# Patient Record
Sex: Male | Born: 1970
Health system: Southern US, Community
[De-identification: ages and names within clinical notes are randomized; demographics above are authoritative.]

## PROBLEM LIST (undated history)

## (undated) DIAGNOSIS — Z789 Other specified health status: Secondary | ICD-10-CM

## (undated) DIAGNOSIS — J45909 Unspecified asthma, uncomplicated: Secondary | ICD-10-CM

## (undated) HISTORY — DX: Other specified health status: Z78.9

---

## 2008-05-27 ENCOUNTER — Encounter: Admission: RE | Admit: 2008-05-27 | Discharge: 2008-05-27 | Payer: Self-pay | Admitting: Chiropractic Medicine

## 2013-03-18 ENCOUNTER — Ambulatory Visit: Payer: Self-pay

## 2017-05-08 ENCOUNTER — Encounter: Payer: Self-pay | Admitting: Family Medicine

## 2017-05-08 ENCOUNTER — Ambulatory Visit (INDEPENDENT_AMBULATORY_CARE_PROVIDER_SITE_OTHER): Payer: Self-pay | Admitting: Family Medicine

## 2017-05-08 VITALS — BP 133/83 | HR 69 | Temp 98.6°F | Resp 14 | Ht 61.0 in | Wt 166.0 lb

## 2017-05-08 DIAGNOSIS — E663 Overweight: Secondary | ICD-10-CM

## 2017-05-08 DIAGNOSIS — R609 Edema, unspecified: Secondary | ICD-10-CM

## 2017-05-08 DIAGNOSIS — Z131 Encounter for screening for diabetes mellitus: Secondary | ICD-10-CM

## 2017-05-08 DIAGNOSIS — Z23 Encounter for immunization: Secondary | ICD-10-CM

## 2017-05-08 DIAGNOSIS — Z789 Other specified health status: Secondary | ICD-10-CM

## 2017-05-08 DIAGNOSIS — Z13220 Encounter for screening for lipoid disorders: Secondary | ICD-10-CM

## 2017-05-08 LAB — CBC
HCT: 43.1 % (ref 38.5–50.0)
HEMOGLOBIN: 14.8 g/dL (ref 13.2–17.1)
MCH: 31.7 pg (ref 27.0–33.0)
MCHC: 34.3 g/dL (ref 32.0–36.0)
MCV: 92.3 fL (ref 80.0–100.0)
MPV: 9.6 fL (ref 7.5–12.5)
Platelets: 317 10*3/uL (ref 140–400)
RBC: 4.67 10*6/uL (ref 4.20–5.80)
RDW: 12.4 % (ref 11.0–15.0)
WBC: 9.1 10*3/uL (ref 3.8–10.8)

## 2017-05-08 LAB — LIPID PANEL
Cholesterol: 230 mg/dL — ABNORMAL HIGH (ref <200)
HDL: 43 mg/dL (ref >40)
LDL Cholesterol (Calc): 149 mg/dL (calc) — ABNORMAL HIGH
Non-HDL Cholesterol (Calc): 187 mg/dL (calc) — ABNORMAL HIGH (ref <130)
TRIGLYCERIDES: 236 mg/dL — AB (ref <150)
Total CHOL/HDL Ratio: 5.3 (calc) — ABNORMAL HIGH (ref <5.0)

## 2017-05-08 LAB — POCT GLYCOSYLATED HEMOGLOBIN (HGB A1C): HEMOGLOBIN A1C: 5.9

## 2017-05-08 NOTE — Progress Notes (Signed)
Subjective:    Patient ID: Mark Love, male    DOB: 06/10/1970, 46 y.o.   MRN: [id number]  HPI Mark Love, a 46 year old male presents accompanied by roommate to establish care.  Patient primarily speaks Spanish, utilizing video interpreter to assist with communication.  Patient has not had a primary provider due to insurance and financial constraints.  Patient states that he is here for routine checkup.  He generally feels well and is without complaint.  He has never had a prostate exam.  He does not follow a low-fat, low-sodium diet.  He denies a family history of  cardiovascular disease, hypertension or diabetes.  He currently denies headache, fatigue, dizziness, chest pains, nausea, vomiting, or diarrhea.   Past Medical History:  Diagnosis Date  . Language barrier to communication    Social History   Socioeconomic History  . Marital status: Married    Spouse name: Not on file  . Number of children: Not on file  . Years of education: Not on file  . Highest education level: Not on file  Social Needs  . Financial resource strain: Not on file  . Food insecurity - worry: Not on file  . Food insecurity - inability: Not on file  . Transportation needs - medical: Not on file  . Transportation needs - non-medical: Not on file  Occupational History  . Not on file  Tobacco Use  . Smoking status: Never Smoker  . Smokeless tobacco: Never Used  Substance and Sexual Activity  . Alcohol use: Yes    Comment: 1 or 2 daily.   . Drug use: No  . Sexual activity: Not on file  Other Topics Concern  . Not on file  Social History Narrative  . Not on file  There is no immunization history for the selected administration types on file for this patient. No Known Allergies Review of Systems  Constitutional: Negative.  Negative for fatigue, fever and unexpected weight change.  Respiratory: Negative.   Cardiovascular: Negative.   Gastrointestinal: Negative.   Endocrine: Negative for  polydipsia, polyphagia and polyuria.  Genitourinary: Negative.   Musculoskeletal: Negative.   Skin: Negative.   Neurological: Negative.   Hematological: Negative.   Psychiatric/Behavioral: Negative.  Negative for decreased concentration, dysphoric mood, sleep disturbance and suicidal ideas.       Objective:   Physical Exam  Constitutional: He is oriented to person, place, and time.  HENT:  Head: Normocephalic and atraumatic.  Right Ear: External ear normal.  Left Ear: External ear normal.  Mouth/Throat: Oropharynx is clear and moist.  Eyes: Conjunctivae are normal. Pupils are equal, round, and reactive to light.  Neck: Normal range of motion. Neck supple.  Pulmonary/Chest: Effort normal and breath sounds normal.  Abdominal: Soft. Bowel sounds are normal. He exhibits no distension. There is no tenderness.  Musculoskeletal: Normal range of motion.  Neurological: He is alert and oriented to person, place, and time. He has normal reflexes.  Skin: Skin is warm and dry.  Right facial swelling, non tender to palpation  Psychiatric: He has a normal mood and affect. His behavior is normal. Judgment and thought content normal.      BP 133/83 (BP Location: Right Arm, Patient Position: Sitting, Cuff Size: Normal)   Pulse 69   Temp 98.6 F (37 C) (Oral)   Resp 14   Ht 5\' 1"  (1.549 m)   Wt 166 lb (75.3 kg)   SpO2 100%   BMI 31.37 kg/m  Assessment & Plan:  1.  Screening for diabetes mellitus - HgB A1c  2. Need for Tdap vaccination - Tdap vaccine greater than or equal to 7yo IM  3. Influenza vaccination given - Flu Vaccine QUAD 36+ mos IM (Fluarix & Fluzone Quad PF  4. Screening cholesterol level - Lipid Panel  5. Overweight Body mass index is 31.37 kg/m. Recommend a lowfat, low carbohydrate diet divided over 5-6 small meals, increase water intake to 6-8 glasses, and 150 minutes per week of cardiovascular exercise.    6. Language barrier to communication Patient primarily  speaks Spanish, utilizing video interpreter to assist with communication  7. Parotid swelling - CBC   RTC: 6 months for CPE   Nolon NationsLaChina Moore Hermina Barnard  MSN, FNP-C Patient [facility] [address]  [place], [place] [id number] [phone]

## 2017-05-08 NOTE — Patient Instructions (Signed)
Colesterol (Cholesterol) El colesterol es una sustancia blanca y cerosa parecida a la grasa que el organismo necesita en pequeas cantidades. El hgado fabrica todo el colesterol que necesita. La sangre lo transporta desde el hgado a travs de los vasos sanguneos. Los depsitos de colesterol (placa) pueden acumularse en las paredes de los vasos sanguneos, lo que ocasiona el estrechamiento y la rigidez de las arterias. Las placas de colesterol aumentan el riesgo de ataque cardaco e ictus. Aunque sea muy elevado, la concentracin de colesterol no puede percibirse. La nica forma de saber que tiene colesterol alto es mediante un anlisis de Poipusangre. Una vez que se conocen las concentraciones de Oncologistcolesterol, se Tourist information centre managerdebe llevar un registro de los Harmonyresultados de Rocky Pointlos anlisis. Trabaje con el mdico para Goodrich Corporationmantener las concentraciones en el rango deseado. QU SIGNIFICAN LOS RESULTADOS?  El colesterol total es una medida general de todo el colesterol en Parissangre.  El LDL es el que se conoce como colesterol malo y es el que se deposita en las paredes de las arterias. Su concentracin debe ser baja.  El HDL es el colesterol bueno porque limpia las arterias y arrastra el LDL. Su concentracin debe ser alta.  Los triglicridos son grasas que el cuerpo puede quemar ya sea para energa o para almacenamiento. Las concentraciones altas estn estrechamente vinculadas con las enfermedades cardacas.  CULES SON LAS CONCENTRACIONES DE COLESTEROL DESEADAS?  El colesterol total por debajo de 200.  El LDL por debajo de 100 en las personas en riesgo y por debajo de 70 en aquellas que corren un riesgo muy alto.  El HDL por Seychellesarriba de 50es bueno y por Seychellesarriba de 60 es lo mejor.  Los triglicridos por debajo de 150.  CMO PUEDO BAJAR EL COLESTEROL?  Dieta Siga los programas de alimentacin que el mdico le indique. ? Elija el pescado o la carne blanca de pollo y Okabenapavo, asados u horneados. Limite los cortes grasos de carne  roja, los alimentos fritos y las carnes procesadas, como las salchichas y los embutidos. ? Coma gran cantidad de frutas y verduras frescas. ? Elija los cereales integrales, los frijoles, las pastas, las papas y los cereales. ? Use solamente pequeas cantidades de aceite de oliva, maz o canola. ? No coma mantequilla, Leakeymayonesa, margarina o aceites de Ajopalmiste. ? Evite los alimentos que contengan grasas trans. ? Hilton Hotelsome leche semidescremada o sin grasa y coma yogur y quesos bajos en grasas o sin grasa. Evite la Eastman Kodakleche entera, la crema, los Lauderdale Lakeshelados, las yemas de Plain Viewhuevo y los quesos enteros. ? Los postres sanos incluyen la torta ngel, los bocadillos de Tunneltonjengibre, las Gaffergalletas con forma de Athaliaanimales, los caramelos duros, los helados de agua y el yogur bajo en grasa o sin grasa. Evite las Eriemasas, tortas, pasteles y Rosendalegalletas.  Haga actividad fsica. Siga los programas de ejercicios segn las indicaciones del mdico. ? Un programa regular ayuda a Advertising account executivebajar el colesterol LDL y aumentar el HDL, ? y a Art gallery managercontrolar el peso. ? Haga cosas que aumenten su nivel de Rancho Banqueteactividad, por Isabelejemplo, haga Barnhartjardinera, salga a caminar o suba y baje las escaleras. Pregntele al mdico cmo puede aumentar la actividad en su vida cotidiana.  Medicamentos. Tome todos los medicamentos como le indic el mdico. ? El mdico puede recetarle medicamentos para ayudar a Advertising account executivebajar el colesterol y reducir el riesgo de enfermedades cardacas. ? Si tiene varios factores de riesgo, tal vez tenga que tomar medicamentos, incluso si las concentraciones son normales.  Esta informacin no tiene como fin  reemplazar el consejo del mdico. Asegrese de hacerle al mdico cualquier pregunta que tenga. Document Released: 03/14/2005 Document Revised: 06/25/2014 Document Reviewed: 12/03/2015 Elsevier Interactive Patient Education  2017 [employer]

## 2017-05-14 ENCOUNTER — Telehealth: Payer: Self-pay

## 2017-05-14 ENCOUNTER — Other Ambulatory Visit: Payer: Self-pay | Admitting: Family Medicine

## 2017-05-14 DIAGNOSIS — E785 Hyperlipidemia, unspecified: Secondary | ICD-10-CM

## 2017-05-14 DIAGNOSIS — E1169 Type 2 diabetes mellitus with other specified complication: Secondary | ICD-10-CM | POA: Insufficient documentation

## 2017-05-14 MED ORDER — ASPIRIN EC 81 MG PO TBEC: 81.0000 mg | DELAYED_RELEASE_TABLET | Freq: Every day | ORAL | 11 refills | Status: DC

## 2017-05-14 MED ORDER — ATORVASTATIN CALCIUM 20 MG PO TABS: 20.0000 mg | ORAL_TABLET | Freq: Every day | ORAL | 3 refills | Status: DC

## 2017-05-14 NOTE — Progress Notes (Signed)
Meds ordered this encounter  Medications  . atorvastatin (LIPITOR) 20 MG tablet    Sig: Take 1 tablet (20 mg total) by mouth daily.    Dispense:  90 tablet    Refill:  3  . aspirin EC 81 MG tablet    Sig: Take 1 tablet (81 mg total) by mouth daily.    Dispense:  30 tablet    Refill:  11

## 2017-05-14 NOTE — Telephone Encounter (Signed)
Called, no answer. NO voicemail picked up, will try later.

## 2017-05-14 NOTE — Telephone Encounter (Signed)
-----   Message from Massie MaroonLachina M Hollis, Oregon[place]NP sent at 05/14/2017  5:40 AM EST ----- Regarding: lab results Please inform patient that cholesterol is elevated. Will start Atorvastatin 20 mg every evening with dinner. Will also start aspirin 81 mg daily as a stroke preventative. Recommend a lowfat, low carbohydrate diet divided over 5-6 small meals, increase water intake to 6-8 glasses, and 150 minutes per week of cardiovascular exercise. Will follow up in 3 months to repeat labs. Please schedule lab appointment.  Thanks

## 2017-05-15 NOTE — Telephone Encounter (Signed)
Called, No answer and no voicemail.

## 2017-05-16 NOTE — Telephone Encounter (Signed)
Tried to call, no answer. Will mail letter to patient. Thanks!

## 2017-09-05 ENCOUNTER — Encounter (HOSPITAL_COMMUNITY): Payer: Self-pay | Admitting: *Deleted

## 2017-09-05 ENCOUNTER — Emergency Department (HOSPITAL_COMMUNITY)
Admission: EM | Admit: 2017-09-05 | Discharge: 2017-09-05 | Disposition: A | Discharge status: Home / Self Care | Payer: Self-pay | Attending: Emergency Medicine | Admitting: Emergency Medicine

## 2017-09-05 ENCOUNTER — Emergency Department (HOSPITAL_COMMUNITY): Payer: Self-pay

## 2017-09-05 ENCOUNTER — Other Ambulatory Visit: Payer: Self-pay

## 2017-09-05 DIAGNOSIS — Y9289 Other specified places as the place of occurrence of the external cause: Secondary | ICD-10-CM | POA: Insufficient documentation

## 2017-09-05 DIAGNOSIS — Y999 Unspecified external cause status: Secondary | ICD-10-CM | POA: Insufficient documentation

## 2017-09-05 DIAGNOSIS — Y9389 Activity, other specified: Secondary | ICD-10-CM | POA: Insufficient documentation

## 2017-09-05 DIAGNOSIS — W231XXA Caught, crushed, jammed, or pinched between stationary objects, initial encounter: Secondary | ICD-10-CM | POA: Insufficient documentation

## 2017-09-05 DIAGNOSIS — S61210A Laceration without foreign body of right index finger without damage to nail, initial encounter: Secondary | ICD-10-CM | POA: Insufficient documentation

## 2017-09-05 MED ORDER — CEPHALEXIN 500 MG PO CAPS: 500.0000 mg | ORAL_CAPSULE | Freq: Four times a day (QID) | ORAL | 0 refills | Status: DC

## 2017-09-05 NOTE — ED Notes (Signed)
Translator used to go over discharge instructions.   

## 2017-09-05 NOTE — ED Notes (Signed)
In-house spanish interpreter [phone]

## 2017-09-05 NOTE — ED Triage Notes (Addendum)
To ED for treatment of lac to bend of right first finger. Pt was working with metal sheet yesterday when injury happened. No bleeding. Able to bend finger but not fully due to swelling. Inhouse medical interpretor used

## 2017-09-05 NOTE — ED Notes (Signed)
Jeff PA at bedside.  

## 2017-09-05 NOTE — ED Provider Notes (Signed)
[facility] EMERGENCY DEPARTMENT Provider Note   CSN: [id number] Arrival date & time: 09/05/17  3664   History   Chief Complaint Chief Complaint  Patient presents with  . Finger Injury    HPI Mark Love is a 47 y.o. male.  HPI   47 year old male presents today with laceration to his right index finger.  Patient notes yesterday afternoon he suffered a laceration to the flexor surface at the PIP of the right second digit.  He notes this pinched between scaffolding.  He denies any loss of range of motion, reports some swelling that started today.  Patient denies any discharge from the wound.  Patient reports he put coffee on to help heal it.    History reviewed. No pertinent past medical history.  There are no active problems to display for this patient.   History reviewed. No pertinent surgical history.     Home Medications    Prior to Admission medications   Medication Sig Start Date End Date Taking? Authorizing Provider  cephALEXin (KEFLEX) 500 MG capsule Take 1 capsule (500 mg total) by mouth 4 (four) times daily. 09/05/17   Eyvonne Mechanic, PA-C    Family History No family history on file.  Social History Social History   Tobacco Use  . Smoking status: Not on file  Substance Use Topics  . Alcohol use: Not on file  . Drug use: Not on file     Allergies   Patient has no known allergies.   Review of Systems Review of Systems  All other systems reviewed and are negative.    Physical Exam Updated Vital Signs BP 122/83 (BP Location: Left Arm)   Pulse 66   Temp 97.7 F (36.5 C) (Oral)   Resp 16   SpO2 96%   Physical Exam  Constitutional: He is oriented to person, place, and time. He appears well-developed and well-nourished.  HENT:  Head: Normocephalic and atraumatic.  Eyes: Pupils are equal, round, and reactive to light. Conjunctivae are normal. Right eye exhibits no discharge. Left eye exhibits no discharge. No scleral  icterus.  Neck: Normal range of motion. No JVD present. No tracheal deviation present.  Pulmonary/Chest: Effort normal. No stridor.  Musculoskeletal:  0.5 cm wound to the right second flexor PIP surface-surrounding redness, no discharge swelling noted throughout the finger full active range of motion  Neurological: He is alert and oriented to person, place, and time. Coordination normal.  Psychiatric: He has a normal mood and affect. His behavior is normal. Judgment and thought content normal.  Nursing note and vitals reviewed.    ED Treatments / Results  Labs (all labs ordered are listed, but only abnormal results are displayed) Labs Reviewed - No data to display  EKG  EKG Interpretation None       Radiology Dg Finger Index Right  Result Date: 09/05/2017 CLINICAL DATA:  Second digit pain EXAM: RIGHT INDEX FINGER 2+V COMPARISON:  None. FINDINGS: Soft tissue swelling is noted. No radiopaque foreign body is seen. No bony abnormality is seen. IMPRESSION: Soft tissue swelling of the second digit without bony abnormality. Electronically Signed   By: Alcide Clever M.D.   On: 09/05/2017 12:00    Procedures Procedures (including critical care time)  Medications Ordered in ED Medications - No data to display   Initial Impression / Assessment and Plan / ED Course  I have reviewed the triage vital signs and the nursing notes.  Pertinent labs & imaging results that were available during  my care of the patient were reviewed by me and considered in my medical decision making (see chart for details).      Final Clinical Impressions(s) / ED Diagnoses   Final diagnoses:  Laceration of right index finger without foreign body without damage to nail, initial encounter    47 year old male presents with a laceration to his hand.  This happened yesterday patient does have swelling I am unable to determine if there is true infection or secondary to the trauma.  However on the side of caution  and treat patient with antibiotics, leave the wound open as this is a very minimal wound.  Patient is instructed to return immediately for any new or worsening signs or symptoms.  Wound was cleansed here, metal splint placed.  Patient discharged with no further questions or concerns.  In-house medical translator was used for assistance.  ED Discharge Orders        Ordered    cephALEXin (KEFLEX) 500 MG capsule  4 times daily     09/05/17 1249       Eyvonne MechanicHedges, Jonia Oakey, PA-C 09/05/17 1258    Mabe, Latanya MaudlinMartha L, MD 09/05/17 1309

## 2017-09-05 NOTE — Discharge Instructions (Addendum)
Please read attached information. If you experience any new or worsening signs or symptoms please return to the emergency room for evaluation. Please follow-up with your primary care provider or specialist as discussed. Please use medication prescribed only as directed and discontinue taking if you have any concerning signs or symptoms.   °

## 2017-10-20 ENCOUNTER — Emergency Department (HOSPITAL_COMMUNITY): Payer: Self-pay

## 2017-10-20 ENCOUNTER — Emergency Department (HOSPITAL_COMMUNITY)
Admission: EM | Admit: 2017-10-20 | Discharge: 2017-10-20 | Disposition: A | Discharge status: Home / Self Care | Payer: Self-pay | Attending: Emergency Medicine | Admitting: Emergency Medicine

## 2017-10-20 ENCOUNTER — Encounter (HOSPITAL_COMMUNITY): Payer: Self-pay

## 2017-10-20 DIAGNOSIS — J069 Acute upper respiratory infection, unspecified: Secondary | ICD-10-CM | POA: Insufficient documentation

## 2017-10-20 DIAGNOSIS — J40 Bronchitis, not specified as acute or chronic: Secondary | ICD-10-CM | POA: Insufficient documentation

## 2017-10-20 DIAGNOSIS — J302 Other seasonal allergic rhinitis: Secondary | ICD-10-CM | POA: Insufficient documentation

## 2017-10-20 DIAGNOSIS — B9789 Other viral agents as the cause of diseases classified elsewhere: Secondary | ICD-10-CM | POA: Insufficient documentation

## 2017-10-20 MED ORDER — PREDNISONE 20 MG PO TABS: 40.0000 mg | ORAL_TABLET | Freq: Every day | ORAL | 0 refills | Status: AC

## 2017-10-20 MED ORDER — PREDNISONE 20 MG PO TABS
60.0000 mg | ORAL_TABLET | Freq: Once | ORAL | Status: AC
  Administered 2017-10-20: 60 mg via ORAL
  Filled 2017-10-20: qty 3

## 2017-10-20 MED ORDER — ALBUTEROL SULFATE HFA 108 (90 BASE) MCG/ACT IN AERS
2.0000 | INHALATION_SPRAY | Freq: Once | RESPIRATORY_TRACT | Status: AC
  Administered 2017-10-20: 2 via RESPIRATORY_TRACT
  Filled 2017-10-20: qty 6.7

## 2017-10-20 MED ORDER — IPRATROPIUM-ALBUTEROL 0.5-2.5 (3) MG/3ML IN SOLN
3.0000 mL | Freq: Once | RESPIRATORY_TRACT | Status: AC
  Administered 2017-10-20: 3 mL via RESPIRATORY_TRACT
  Filled 2017-10-20: qty 3

## 2017-10-20 NOTE — ED Provider Notes (Signed)
[facility] EMERGENCY DEPARTMENT Provider Note   CSN: [id number] Arrival date & time: 10/20/17  1640     History   Chief Complaint Chief Complaint  Patient presents with  . Cough    HPI Mark Love is a 47 y.o. male.  Mark Love is a 47 y.o. Male who is otherwise healthy, presents to the ED for evaluation of nasal congestion, cough and wheezing.  Patient reports he has had a seasonal allergies in the past but they have never bothered him as much as this week, he is not currently on any allergy medications.  Patient reports he has been congested and had a lot of nasal drainage and over the past few days he is also developed a cough that is occasionally productive of phlegm.  Patient denies any chest pain outside of when he is coughing it feels a little sore.  He reports he feels like his breathing is noisy he does not have a history of asthma or COPD and is a non-smoker.  He is able to speak in full sentences and does not appear uncomfortable.  He denies any fevers or chills, no sore throat, or ear pain.  Headaches occasionally from coughing but these do not persist and resolve when coughing spell ends.  No abdominal pain, nausea or vomiting.  Patient has not tried any medications at home to treat symptoms.   Patient is Spanish-speaking, [employer] interpreter services used.  History reviewed. No pertinent past medical history.  There are no active problems to display for this patient.   History reviewed. No pertinent surgical history.      Home Medications    Prior to Admission medications   Medication Sig Start Date End Date Taking? Authorizing Provider  cephALEXin (KEFLEX) 500 MG capsule Take 1 capsule (500 mg total) by mouth 4 (four) times daily. 09/05/17   Hedges, Tinnie Gens, PA-C  predniSONE (DELTASONE) 20 MG tablet Take 2 tablets (40 mg total) by mouth daily for 5 days. 10/20/17 10/25/17  Dartha Lodge, PA-C    Family History No family history on  file.  Social History Social History   Tobacco Use  . Smoking status: Not on file  Substance Use Topics  . Alcohol use: Not on file  . Drug use: Not on file     Allergies   Patient has no known allergies.   Review of Systems Review of Systems  Constitutional: Negative for chills and fever.  HENT: Positive for congestion, postnasal drip, rhinorrhea and sneezing. Negative for ear discharge, ear pain and sore throat.   Eyes: Negative for pain, discharge, redness and itching.  Respiratory: Positive for cough, shortness of breath and wheezing.   Cardiovascular: Negative for chest pain, palpitations and leg swelling.  Gastrointestinal: Negative for abdominal pain, nausea and vomiting.  Musculoskeletal: Negative for arthralgias, joint swelling and myalgias.  Skin: Negative for color change and rash.  Neurological: Positive for headaches. Negative for dizziness and syncope.     Physical Exam Updated Vital Signs BP 124/71 (BP Location: Right Arm)   Pulse 74   Temp 99.9 F (37.7 C) (Oral)   Resp (!) 22   SpO2 96%   Physical Exam  Constitutional: He appears well-developed and well-nourished. No distress.  HENT:  Head: Normocephalic and atraumatic.  Mouth/Throat: Oropharynx is clear and moist.  TMs clear with good landmarks, moderate nasal mucosa edema with clear rhinorrhea, posterior oropharynx clear and moist, with some erythema, no edema or exudates, uvula midline  Eyes: Right eye  exhibits no discharge. Left eye exhibits no discharge.  Neck: Neck supple.  Cardiovascular: Normal rate, regular rhythm, normal heart sounds and intact distal pulses.  Pulmonary/Chest: Effort normal. No stridor. No respiratory distress. He has wheezes. He has no rales.  Respirations equal and unlabored, patient able to speak in full sentences, lungs with bilateral end expiratory wheezes and some transmitted upper airway sounds, but lungs otherwise clear with good air movement, no rhonchi or  crackles   Abdominal: Soft. Bowel sounds are normal. He exhibits no distension and no mass. There is no tenderness. There is no guarding.  Musculoskeletal: He exhibits no deformity.  Neurological: He is alert. Coordination normal.  Skin: Skin is warm and dry. Capillary refill takes less than 2 seconds. He is not diaphoretic.  Psychiatric: He has a normal mood and affect. His behavior is normal.  Nursing note and vitals reviewed.    ED Treatments / Results  Labs (all labs ordered are listed, but only abnormal results are displayed) Labs Reviewed - No data to display  EKG None  Radiology Dg Chest 2 View  Result Date: 10/20/2017 CLINICAL DATA:  Three days of cough and body ache with fatigue. EXAM: CHEST - 2 VIEW COMPARISON:  None. FINDINGS: The heart size and mediastinal contours are within normal limits. Both lungs are clear. The visualized skeletal structures are unremarkable. IMPRESSION: No active cardiopulmonary disease. Electronically Signed   By: Tollie Eth M.D.   On: 10/20/2017 18:39    Procedures Procedures (including critical care time)  Medications Ordered in ED Medications  ipratropium-albuterol (DUONEB) 0.5-2.5 (3) MG/3ML nebulizer solution 3 mL (3 mLs Nebulization Given 10/20/17 1933)  predniSONE (DELTASONE) tablet 60 mg (60 mg Oral Given 10/20/17 1931)  albuterol (PROVENTIL HFA;VENTOLIN HFA) 108 (90 Base) MCG/ACT inhaler 2 puff (2 puffs Inhalation Given 10/20/17 2007)     Initial Impression / Assessment and Plan / ED Course  I have reviewed the triage vital signs and the nursing notes.  Pertinent labs & imaging results that were available during my care of the patient were reviewed by me and considered in my medical decision making (see chart for details).  Patient presents to the ED for a few days of nasal congestion, rhinorrhea and sneezing, associated with productive cough, wheezing and some intermittent shortness of breath.  Patient denies any associated chest pain.   On arrival patient with a low-grade fever, mildly tachypneic with respirations of 22, all other vitals within normal range.  On exam patient with nasal congestion, oropharynx is mildly erythematous but otherwise clear, no evidence of otitis.  Lungs with bilateral scattered end expiratory wheeze, but good air movement throughout.  Will get chest x-ray, and give DuoNeb and dose of prednisone here in the ED.  Patient does not have history of asthma or COPD, and is a non-smoker, suggest reactive bronchitis from exacerbation of seasonal allergies.  Chest x-ray is clear.  Patient's lungs have cleared completely after nebulizer treatment and steroids, patient provided albuterol inhaler in the ED, at this time he stable for discharge home with continued symptomatic management.  Prescribed short burst of steroids.  Encourage patient to use Zyrtec, Flonase and Mucinex as well.  He is to follow-up with his primary doctor.  Return precautions discussed.  Patient expresses understanding and is in agreement with plan.  Final Clinical Impressions(s) / ED Diagnoses   Final diagnoses:  Viral URI with cough  Seasonal allergies  Bronchitis    ED Discharge Orders  Ordered    predniSONE (DELTASONE) 20 MG tablet  Daily     10/20/17 1955       Dartha Lodge, [place] 10/21/17 1308    Arby Barrette, MD 10/21/17 [phone]

## 2017-10-20 NOTE — ED Triage Notes (Signed)
Patient complains of 3 days of cough and with body aches and fatigue, non-smoker, low grade fever

## 2017-10-20 NOTE — ED Notes (Signed)
Used interpretor pad to explain discharge instructions and prescriptions.  No questions at this time.

## 2017-10-20 NOTE — Discharge Instructions (Signed)
Take steroids once daily as directed.  You may use her inhaler as needed every 4-6 hours.  Please use Flonase and Zyrtec to help with nasal congestion, and Mucinex for cough, all these medications can be found over-the-counter.  Please follow-up with the [facility].  Return to the emergency department if you have persistent fevers, shortness of breath or difficulty breathing, chest pain even when you are not coughing or any other new or concerning symptoms.

## 2017-10-30 ENCOUNTER — Emergency Department (HOSPITAL_COMMUNITY): Payer: Self-pay

## 2017-10-30 ENCOUNTER — Emergency Department (HOSPITAL_COMMUNITY)
Admission: EM | Admit: 2017-10-30 | Discharge: 2017-10-31 | Disposition: A | Discharge status: Home / Self Care | Payer: Self-pay | Attending: Emergency Medicine | Admitting: Emergency Medicine

## 2017-10-30 ENCOUNTER — Other Ambulatory Visit: Payer: Self-pay

## 2017-10-30 ENCOUNTER — Encounter (HOSPITAL_COMMUNITY): Payer: Self-pay | Admitting: Emergency Medicine

## 2017-10-30 DIAGNOSIS — R058 Other specified cough: Secondary | ICD-10-CM

## 2017-10-30 DIAGNOSIS — R05 Cough: Secondary | ICD-10-CM | POA: Insufficient documentation

## 2017-10-30 DIAGNOSIS — R062 Wheezing: Secondary | ICD-10-CM | POA: Insufficient documentation

## 2017-10-30 LAB — BASIC METABOLIC PANEL
Anion gap: 9 (ref 5–15)
BUN: 18 mg/dL (ref 6–20)
CALCIUM: 9.2 mg/dL (ref 8.9–10.3)
CO2: 21 mmol/L — AB (ref 22–32)
CREATININE: 0.79 mg/dL (ref 0.61–1.24)
Chloride: 110 mmol/L (ref 101–111)
GFR calc non Af Amer: >60 mL/min (ref >60)
GLUCOSE: 131 mg/dL — AB (ref 65–99)
Potassium: 4 mmol/L (ref 3.5–5.1)
Sodium: 140 mmol/L (ref 135–145)

## 2017-10-30 LAB — CBC
HCT: 46 % (ref 39.0–52.0)
Hemoglobin: 16 g/dL (ref 13.0–17.0)
MCH: 32.5 pg (ref 26.0–34.0)
MCHC: 34.8 g/dL (ref 30.0–36.0)
MCV: 93.3 fL (ref 78.0–100.0)
PLATELETS: 314 10*3/uL (ref 150–400)
RBC: 4.93 MIL/uL (ref 4.22–5.81)
RDW: 13.5 % (ref 11.5–15.5)
WBC: 13 10*3/uL — ABNORMAL HIGH (ref 4.0–10.5)

## 2017-10-30 LAB — I-STAT TROPONIN, ED: TROPONIN I, POC: 0 ng/mL (ref 0.00–0.08)

## 2017-10-30 MED ORDER — CEFTRIAXONE SODIUM 250 MG IJ SOLR
250.0000 mg | Freq: Once | INTRAMUSCULAR | Status: AC
  Administered 2017-10-30: 250 mg via INTRAMUSCULAR
  Filled 2017-10-30: qty 250

## 2017-10-30 MED ORDER — IPRATROPIUM-ALBUTEROL 0.5-2.5 (3) MG/3ML IN SOLN
3.0000 mL | Freq: Once | RESPIRATORY_TRACT | Status: AC
  Administered 2017-10-30: 3 mL via RESPIRATORY_TRACT
  Filled 2017-10-30: qty 3

## 2017-10-30 MED ORDER — LIDOCAINE HCL 1 % IJ SOLN
INTRAMUSCULAR | Status: AC
  Administered 2017-10-30: 0.9 mL
  Filled 2017-10-30: qty 20

## 2017-10-30 MED ORDER — ALBUTEROL SULFATE HFA 108 (90 BASE) MCG/ACT IN AERS
2.0000 | INHALATION_SPRAY | RESPIRATORY_TRACT | Status: DC | PRN
  Administered 2017-10-30: 2 via RESPIRATORY_TRACT
  Filled 2017-10-30: qty 6.7

## 2017-10-30 MED ORDER — AZITHROMYCIN 250 MG PO TABS
500.0000 mg | ORAL_TABLET | Freq: Once | ORAL | Status: AC
  Administered 2017-10-30: 500 mg via ORAL
  Filled 2017-10-30: qty 2

## 2017-10-30 MED ORDER — AZITHROMYCIN 250 MG PO TABS: 250.0000 mg | ORAL_TABLET | Freq: Every day | ORAL | 0 refills | Status: DC

## 2017-10-30 NOTE — ED Triage Notes (Signed)
Patient complaining of neck and mid chest pain. Patient is talking without any difficulty. Patient does not speak spanish. Patient is complaining of headache because of cough. Patient states he is coughing up blood now. Patient states this has been going on for two weeks.

## 2017-10-30 NOTE — ED Provider Notes (Signed)
[facility]-EMERGENCY DEPT Provider Note   CSN: [id number] Arrival date & time: 10/30/17  1945     History   Chief Complaint Chief Complaint  Patient presents with  . Chest Pain  . Neck Pain    HPI Mark Love is a 47 y.o. male.  Patient presents to the emergency department with a chief complaint of cough and chest pain.  He states that he was seen about a week ago for the same.  States that he has had no improvement of his symptoms.  He reports productive cough as well as subjective fevers at home.  He states that he has been coughing so much that his throat is sore and he feels hoarse.  He was recently prescribed prednisone and an inhaler which he took as directed, but has run out of both.  He reports some associated shortness of breath and feels that he is coughing up a lot of phlegm.  He denies any long travel.  Denies any leg pain or swelling.  Denies any travel out of the country.  The history is provided by the patient. The history is limited by a language barrier. A language interpreter was used.    History reviewed. No pertinent past medical history.  There are no active problems to display for this patient.   History reviewed. No pertinent surgical history.      Home Medications    Prior to Admission medications   Not on File    Family History History reviewed. No pertinent family history.  Social History Social History   Tobacco Use  . Smoking status: Never Smoker  . Smokeless tobacco: Never Used  Substance Use Topics  . Alcohol use: Not Currently  . Drug use: Never     Allergies   Patient has no known allergies.   Review of Systems Review of Systems  All other systems reviewed and are negative.    Physical Exam Updated Vital Signs BP 113/81 (BP Location: Left Arm)   Pulse 82   Temp 98.2 F (36.8 C) (Oral)   Resp 16   Ht  (1.626 m)   Wt 80 kg (176 lb 5.9 oz)   SpO2 90%   BMI 30.27 kg/m   Physical Exam    Constitutional: He is oriented to person, place, and time. He appears well-developed and well-nourished.  HENT:  Head: Normocephalic and atraumatic.  Eyes: Pupils are equal, round, and reactive to light. Conjunctivae and EOM are normal. Right eye exhibits no discharge. Left eye exhibits no discharge. No scleral icterus.  Neck: Normal range of motion. Neck supple. No JVD present.  Cardiovascular: Normal rate, regular rhythm and normal heart sounds. Exam reveals no gallop and no friction rub.  No murmur heard. Pulmonary/Chest: Effort normal. No respiratory distress. He has wheezes. He has no rales. He exhibits no tenderness.  Diminished lung sounds in bases and some mild expiratory wheezes  Abdominal: Soft. He exhibits no distension and no mass. There is no tenderness. There is no rebound and no guarding.  Musculoskeletal: Normal range of motion. He exhibits no edema or tenderness.  Neurological: He is alert and oriented to person, place, and time.  Skin: Skin is warm and dry.  Psychiatric: He has a normal mood and affect. His behavior is normal. Judgment and thought content normal.  Nursing note and vitals reviewed.    ED Treatments / Results  Labs (all labs ordered are listed, but only abnormal results are displayed) Labs Reviewed  BASIC  METABOLIC PANEL - Abnormal; Notable for the following components:      Result Value   CO2 21 (*)    Glucose, Bld 131 (*)    All other components within normal limits  CBC - Abnormal; Notable for the following components:   WBC 13.0 (*)    All other components within normal limits  I-STAT TROPONIN, ED    EKG None  Radiology Dg Chest 2 View  Result Date: 10/30/2017 CLINICAL DATA:  Chest pain, cough. EXAM: CHEST - 2 VIEW COMPARISON:  None. FINDINGS: Heart size and mediastinal contours are within normal limits. Lungs are clear. No pleural effusion or pneumothorax seen. Osseous structures about the chest are unremarkable. IMPRESSION: No active  cardiopulmonary disease. No evidence of pneumonia or pulmonary edema. Electronically Signed   By: Bary Richard M.D.   On: 10/30/2017 20:39    Procedures Procedures (including critical care time)  Medications Ordered in ED Medications  ipratropium-albuterol (DUONEB) 0.5-2.5 (3) MG/3ML nebulizer solution 3 mL (has no administration in time range)  albuterol (PROVENTIL HFA;VENTOLIN HFA) 108 (90 Base) MCG/ACT inhaler 2 puff (has no administration in time range)  cefTRIAXone (ROCEPHIN) injection 250 mg (has no administration in time range)  azithromycin (ZITHROMAX) tablet 500 mg (has no administration in time range)     Initial Impression / Assessment and Plan / ED Course  I have reviewed the triage vital signs and the nursing notes.  Pertinent labs & imaging results that were available during my care of the patient were reviewed by me and considered in my medical decision making (see chart for details).     Patient with productive cough x1 week.  He states that the coughing spells have caused him to have a sore throat and hoarseness.  This is the neck pain that he complains of at triage.  Complains of pain in his chest while he is coughing.  He does have some diminished lung sounds as well as some expiratory wheezes on exam.  His O2 saturation is 90%.  He is not tachycardic.  He has no lower extremity swelling and has no other DVT or PE risk factors.  I doubt PE.  I think that he could be developing pneumonia, and will likely benefit from antibiotic therapy.  I will also give him a breathing treatment tonight, and ensure that his O2 saturation goes up.  11:18 PM Significantly improved after a breathing treatment.  Will give one more.  Plan for discharge after the next breathing treatment.  Final Clinical Impressions(s) / ED Diagnoses   Final diagnoses:  Wheezing  Productive cough    ED Discharge Orders        Ordered    azithromycin (ZITHROMAX) 250 MG tablet  Daily     10/30/17 2320        Roxy Horseman, PA-C 10/30/17 2321    Little, Ambrose Finland, MD 10/31/17 [phone]

## 2017-10-30 NOTE — ED Notes (Signed)
EKG delayed pt currently in xray.

## 2017-10-31 ENCOUNTER — Encounter: Payer: Self-pay | Admitting: Family Medicine

## 2017-11-01 ENCOUNTER — Encounter (HOSPITAL_COMMUNITY): Payer: Self-pay

## 2017-11-01 ENCOUNTER — Other Ambulatory Visit: Payer: Self-pay

## 2017-11-01 ENCOUNTER — Emergency Department (HOSPITAL_COMMUNITY)
Admission: EM | Admit: 2017-11-01 | Discharge: 2017-11-02 | Disposition: A | Discharge status: Home / Self Care | Payer: Self-pay | Attending: Emergency Medicine | Admitting: Emergency Medicine

## 2017-11-01 ENCOUNTER — Emergency Department (HOSPITAL_COMMUNITY): Payer: Self-pay

## 2017-11-01 DIAGNOSIS — R059 Cough, unspecified: Secondary | ICD-10-CM

## 2017-11-01 DIAGNOSIS — R05 Cough: Secondary | ICD-10-CM | POA: Insufficient documentation

## 2017-11-01 DIAGNOSIS — J45909 Unspecified asthma, uncomplicated: Secondary | ICD-10-CM | POA: Insufficient documentation

## 2017-11-01 DIAGNOSIS — R0602 Shortness of breath: Secondary | ICD-10-CM | POA: Insufficient documentation

## 2017-11-01 HISTORY — DX: Unspecified asthma, uncomplicated: J45.909

## 2017-11-01 LAB — I-STAT CHEM 8, ED
BUN: 8 mg/dL (ref 6–20)
CALCIUM ION: 1.16 mmol/L (ref 1.15–1.40)
CHLORIDE: 105 mmol/L (ref 101–111)
Creatinine, Ser: 0.7 mg/dL (ref 0.61–1.24)
Glucose, Bld: 119 mg/dL — ABNORMAL HIGH (ref 65–99)
HCT: 47 % (ref 39.0–52.0)
Hemoglobin: 16 g/dL (ref 13.0–17.0)
POTASSIUM: 4 mmol/L (ref 3.5–5.1)
SODIUM: 140 mmol/L (ref 135–145)
TCO2: 26 mmol/L (ref 22–32)

## 2017-11-01 MED ORDER — IOPAMIDOL (ISOVUE-370) INJECTION 76%
100.0000 mL | Freq: Once | INTRAVENOUS | Status: AC | PRN
  Administered 2017-11-01: 100 mL via INTRAVENOUS

## 2017-11-01 MED ORDER — ALBUTEROL SULFATE HFA 108 (90 BASE) MCG/ACT IN AERS: 1.0000 | INHALATION_SPRAY | RESPIRATORY_TRACT | 0 refills | Status: DC | PRN

## 2017-11-01 MED ORDER — DOXYCYCLINE HYCLATE 100 MG PO CAPS: 100.0000 mg | ORAL_CAPSULE | Freq: Two times a day (BID) | ORAL | 0 refills | Status: DC

## 2017-11-01 MED ORDER — DOXYCYCLINE HYCLATE 100 MG PO TABS
100.0000 mg | ORAL_TABLET | Freq: Once | ORAL | Status: AC
  Administered 2017-11-02: 100 mg via ORAL
  Filled 2017-11-01: qty 1

## 2017-11-01 MED ORDER — ALBUTEROL SULFATE (2.5 MG/3ML) 0.083% IN NEBU
2.5000 mg | INHALATION_SOLUTION | Freq: Once | RESPIRATORY_TRACT | Status: AC
  Administered 2017-11-01: 2.5 mg via RESPIRATORY_TRACT
  Filled 2017-11-01: qty 3

## 2017-11-01 MED ORDER — PREDNISONE 20 MG PO TABS: ORAL_TABLET | ORAL | 0 refills | Status: DC

## 2017-11-01 MED ORDER — METHYLPREDNISOLONE SODIUM SUCC 125 MG IJ SOLR
125.0000 mg | Freq: Once | INTRAMUSCULAR | Status: AC
  Administered 2017-11-01: 125 mg via INTRAVENOUS
  Filled 2017-11-01: qty 2

## 2017-11-01 MED ORDER — ALBUTEROL SULFATE (2.5 MG/3ML) 0.083% IN NEBU
5.0000 mg | INHALATION_SOLUTION | Freq: Once | RESPIRATORY_TRACT | Status: AC
  Administered 2017-11-01: 5 mg via RESPIRATORY_TRACT
  Filled 2017-11-01: qty 6

## 2017-11-01 MED ORDER — IOPAMIDOL (ISOVUE-370) INJECTION 76%
INTRAVENOUS | Status: AC
  Filled 2017-11-01: qty 100

## 2017-11-01 NOTE — ED Triage Notes (Signed)
Patient c/o a productive cough with yellow sputum x 2 1/2 weeks. Patient also reports that his rib cage and throat are  sore from coughing. Patient was seen 2 days ago in the ED. Patient reports that his Albuterol inhaler is not working and is ahving wheezing..  Triage completed using Video interpreter-Aida [phone]. Patient is spanish speaking.

## 2017-11-01 NOTE — ED Provider Notes (Signed)
Emergency Department Provider Note   I have reviewed the triage vital signs and the nursing notes.   HISTORY  Chief Complaint Cough and Asthma   HPI Mark Love is a 47 y.o. male with history of asthma who presents the emergency room today with persistent cough and shortness of breath.  Patient has been seen a couple times in the last few days for the same thing was given breathing treatments were seem to help but then shortly after the breathing treatment his cough and shortness of breath comes back.  Letter IV chest pain which has persistent wheezing and a lower neck pain.  No fevers. No other associated or modifying symptoms.    Past Medical History:  Diagnosis Date  . Asthma   . Language barrier to communication     Patient Active Problem List   Diagnosis Date Noted  . Hyperlipidemia LDL goal <100 05/14/2017    History reviewed. No pertinent surgical history.  Current Outpatient Rx  . Order #: [id number] Class: Print  . Order #: [id number] Class: Print  . Order #: [id number] Class: Print  . Order #: [id number] Class: Print    Allergies Patient has no known allergies.  History reviewed. No pertinent family history.  Social History Social History   Tobacco Use  . Smoking status: Never Smoker  . Smokeless tobacco: Never Used  Substance Use Topics  . Alcohol use: Not Currently    Comment: 1 or 2 daily.   . Drug use: No    Review of Systems  All other systems negative except as documented in the HPI. All pertinent positives and negatives as reviewed in the HPI. ____________________________________________   PHYSICAL EXAM:  VITAL SIGNS: ED Triage Vitals  Enc Vitals Group     BP 11/01/17 1551 122/81     Pulse Rate 11/01/17 1551 74     Resp 11/01/17 1551 20     Temp 11/01/17 1551 99.7 F (37.6 C)     Temp Source 11/01/17 1551 Oral     SpO2 11/01/17 1551 93 %     Weight 11/01/17 1635 176 lb (79.8 kg)     Height 11/01/17 1635  (1.626 m)   Head Circumference --      Peak Flow --      Pain Score 11/01/17 1623 8     Pain Loc --      Pain Edu? --      Excl. in GC? --     Constitutional: Alert and oriented. Well appearing and in no acute distress. Eyes: Conjunctivae are normal. PERRL. EOMI. Head: Atraumatic. Nose: No congestion/rhinnorhea. Mouth/Throat: Mucous membranes are moist.  Oropharynx non-erythematous. Neck: No stridor.  No meningeal signs.   Cardiovascular: Normal rate, regular rhythm. Good peripheral circulation. Grossly normal heart sounds.   Respiratory: tachypneic respiratory effort.  No retractions. Lungs diminished with diffuse wheezing. Gastrointestinal: Soft and nontender. No distention.  Musculoskeletal: No lower extremity tenderness nor edema. No gross deformities of extremities. Neurologic:  Normal speech and language. No gross focal neurologic deficits are appreciated.  Skin:  Skin is warm, dry and intact. No rash noted.  ____________________________________________   LABS (all labs ordered are listed, but only abnormal results are displayed)  Labs Reviewed  I-STAT CHEM 8, ED - Abnormal; Notable for the following components:      Result Value   Glucose, Bld 119 (*)    All other components within normal limits   ____________________________________________  EKG   EKG Interpretation  Date/Time:  Friday Nov 01 2017 20:58:50 EDT Ventricular Rate:  74 PR Interval:    QRS Duration: 89 QT Interval:  400 QTC Calculation: 444 R Axis:   55 Text Interpretation:  Age not entered, assumed to be  47 years old for purpose of ECG interpretation Sinus rhythm ST elev, probable normal early repol pattern No old tracing to compare Confirmed by Marily Memos [phone]) on 11/01/2017 10:28:06 PM       ____________________________________________  RADIOLOGY  Ct Angio Chest Pe W And/or Wo Contrast  Result Date: 11/01/2017 CLINICAL DATA:  47 year old male with acute cough and shortness of breath. EXAM: CT  ANGIOGRAPHY CHEST WITH CONTRAST TECHNIQUE: Multidetector CT imaging of the chest was performed using the standard protocol during bolus administration of intravenous contrast. Multiplanar CT image reconstructions and MIPs were obtained to evaluate the vascular anatomy. CONTRAST:  ISOVUE-370 IOPAMIDOL (ISOVUE-370) INJECTION 76% COMPARISON:  None. FINDINGS: Cardiovascular: This study is technically adequate but respiratory motion artifact decreases sensitivity, specially in the LOWER lungs. No pulmonary emboli are identified. Heart size normal. No thoracic aortic aneurysm or pericardial effusion. Mediastinum/Nodes: No enlarged mediastinal, hilar, or axillary lymph nodes. Thyroid gland, trachea, and esophagus demonstrate no significant findings. Lungs/Pleura: 2 small RIGHT UPPER lobe airspace opacities probably represent pneumonia. Mild peribronchial thickening identified. No mass, definite nodule, consolidation, pleural effusion or pneumothorax. Upper Abdomen: No acute abnormality.  Hepatic steatosis identified. Musculoskeletal: No acute or suspicious bony abnormality. Review of the MIP images confirms the above findings. IMPRESSION: 1. No evidence of pulmonary emboli 2. Small RIGHT UPPER lobe airspace opacities-question pneumonia. 3. Mild peribronchial thickening 4. Hepatic steatosis Electronically Signed   By: Harmon Pier M.D.   On: 11/01/2017 21:45    ____________________________________________   PROCEDURES  Procedure(s) performed:   Procedures   ____________________________________________   INITIAL IMPRESSION / ASSESSMENT AND PLAN / ED COURSE  Patient is not improving as expected with the appropriate medications.  Will evaluate other causes for his wheezing such as pulmonary embolus or heart failure with CT scan will give breathing treatments and steroids in the meantime  CT with evidence of pneumonia.  Patient ambulated with oxygen is an 88 to 90% range.  He states he feels much better  than he did previously.  When asked specifically about when he was walking around he states his breathing was not abnormal.  Discharge on antibiotics and continue steroids.     Pertinent labs & imaging results that were available during my care of the patient were reviewed by me and considered in my medical decision making (see chart for details).  ____________________________________________  FINAL CLINICAL IMPRESSION(S) / ED DIAGNOSES  Final diagnoses:  Cough     MEDICATIONS GIVEN DURING THIS VISIT:  Medications  iopamidol (ISOVUE-370) 76 % injection (has no administration in time range)  doxycycline (VIBRA-TABS) tablet 100 mg (has no administration in time range)  albuterol (PROVENTIL) (2.5 MG/3ML) 0.083% nebulizer solution 5 mg (5 mg Nebulization Given 11/01/17 1638)  methylPREDNISolone sodium succinate (SOLU-MEDROL) 125 mg/2 mL injection 125 mg (125 mg Intravenous Given 11/01/17 2047)  iopamidol (ISOVUE-370) 76 % injection 100 mL (100 mLs Intravenous Contrast Given 11/01/17 2126)  albuterol (PROVENTIL) (2.5 MG/3ML) 0.083% nebulizer solution 2.5 mg (2.5 mg Nebulization Given 11/01/17 2215)     NEW OUTPATIENT MEDICATIONS STARTED DURING THIS VISIT:  New Prescriptions   ALBUTEROL (PROVENTIL HFA;VENTOLIN HFA) 108 (90 BASE) MCG/ACT INHALER    Inhale 1-2 puffs into the lungs every 4 (four) hours as needed for wheezing or shortness of breath.  DOXYCYCLINE (VIBRAMYCIN) 100 MG CAPSULE    Take 1 capsule (100 mg total) by mouth 2 (two) times daily. One po bid x 7 days   PREDNISONE (DELTASONE) 20 MG TABLET    2 tabs po daily x 4 days    Note:  This note was prepared with assistance of Dragon voice recognition software. Occasional wrong-word or sound-a-like substitutions may have occurred due to the inherent limitations of voice recognition software.   Marily Memos, MD 11/01/17 [phone]

## 2017-11-01 NOTE — ED Notes (Signed)
Ambulated pt to the bathroom and back to room.  After pt finished using the bathroom his O2 was at 88% and then picked back up to 91% on the way back to his room.

## 2017-11-06 ENCOUNTER — Ambulatory Visit: Payer: Self-pay | Admitting: Family Medicine

## 2017-11-22 ENCOUNTER — Emergency Department (HOSPITAL_COMMUNITY): Payer: Self-pay

## 2017-11-22 ENCOUNTER — Encounter (HOSPITAL_COMMUNITY): Payer: Self-pay

## 2017-11-22 ENCOUNTER — Other Ambulatory Visit: Payer: Self-pay

## 2017-11-22 ENCOUNTER — Emergency Department (HOSPITAL_COMMUNITY)
Admission: EM | Admit: 2017-11-22 | Discharge: 2017-11-22 | Disposition: A | Discharge status: Home / Self Care | Payer: Self-pay | Attending: Emergency Medicine | Admitting: Emergency Medicine

## 2017-11-22 DIAGNOSIS — J4541 Moderate persistent asthma with (acute) exacerbation: Secondary | ICD-10-CM | POA: Insufficient documentation

## 2017-11-22 DIAGNOSIS — R05 Cough: Secondary | ICD-10-CM | POA: Insufficient documentation

## 2017-11-22 DIAGNOSIS — R059 Cough, unspecified: Secondary | ICD-10-CM

## 2017-11-22 DIAGNOSIS — J029 Acute pharyngitis, unspecified: Secondary | ICD-10-CM | POA: Insufficient documentation

## 2017-11-22 DIAGNOSIS — R0981 Nasal congestion: Secondary | ICD-10-CM | POA: Insufficient documentation

## 2017-11-22 MED ORDER — ALBUTEROL SULFATE (2.5 MG/3ML) 0.083% IN NEBU: 2.5000 mg | INHALATION_SOLUTION | RESPIRATORY_TRACT | 12 refills | Status: DC | PRN

## 2017-11-22 MED ORDER — LORATADINE 10 MG PO TABS: 10.0000 mg | ORAL_TABLET | Freq: Every day | ORAL | 0 refills | Status: DC

## 2017-11-22 MED ORDER — PREDNISONE 20 MG PO TABS: ORAL_TABLET | ORAL | 0 refills | Status: DC

## 2017-11-22 MED ORDER — ALBUTEROL SULFATE (2.5 MG/3ML) 0.083% IN NEBU
5.0000 mg | INHALATION_SOLUTION | Freq: Once | RESPIRATORY_TRACT | Status: AC
  Administered 2017-11-22: 5 mg via RESPIRATORY_TRACT
  Filled 2017-11-22: qty 6

## 2017-11-22 MED ORDER — PREDNISONE 20 MG PO TABS
60.0000 mg | ORAL_TABLET | Freq: Once | ORAL | Status: AC
  Administered 2017-11-22: 60 mg via ORAL
  Filled 2017-11-22: qty 3

## 2017-11-22 MED ORDER — IPRATROPIUM BROMIDE 0.02 % IN SOLN
0.5000 mg | Freq: Once | RESPIRATORY_TRACT | Status: AC
Start: 2017-11-22 — End: 2017-11-22
  Administered 2017-11-22: 0.5 mg via RESPIRATORY_TRACT
  Filled 2017-11-22: qty 2.5

## 2017-11-22 MED ORDER — ALBUTEROL SULFATE HFA 108 (90 BASE) MCG/ACT IN AERS: 1.0000 | INHALATION_SPRAY | RESPIRATORY_TRACT | 0 refills | Status: DC | PRN

## 2017-11-22 NOTE — ED Notes (Signed)
Patient transported to X-ray 

## 2017-11-22 NOTE — ED Notes (Signed)
Bed: WTR7 Expected date:  Expected time:  Means of arrival:  Comments: 

## 2017-11-22 NOTE — Discharge Instructions (Addendum)
Continue to stay well-hydrated. Gargle warm salt water to help with sore throat. Use Mucinex for cough suppression/expectoration of mucus. Use flonase and the netipot to help with nasal congestion and symptoms. Take claritin as directed to help decrease symptoms and frequency of asthma attacks. Use inhaler and/or albuterol nebulizer as directed, as needed for cough/chest congestion/wheezing/shortness of breath/etc. Take prednisone as directed for your asthma exacerbation, starting tomorrow since you received today's dose in the ER today. Follow-up with the  primary care doctor listed above in 5-7 days for recheck of ongoing symptoms and ongoing management of your healthcare; [facility]. Return to emergency department for emergent changing or worsening of symptoms.

## 2017-11-22 NOTE — ED Provider Notes (Signed)
[facility]-EMERGENCY DEPT Provider Note   CSN: [id number] Arrival date & time: 11/22/17  0940     History   Chief Complaint Chief Complaint  Patient presents with  . Sore Throat    HPI Mark Love is a 47 y.o. male with a PMHx of asthma, who presents to the ED with complaints of recurrent wheezing, cough with yellow sputum production, sore throat, and rhinorrhea after finishing his medications given at his last visit.  Chart review reveals that he was seen in the ED on 10/30/17 for cough/URI symptoms; CXR negative and labs reassuring, discharged home with azithromycin and inhaler for presumed early PNA (since he'd been seen about 10 days before that and had continued to worsen).  He was then seen again on 11/01/17 for same thing, had CTA chest done which showed no PE but showed questionable RUL PNA, so he was given albuterol treatments and solumedrol in the ED and improved, and was discharged home on doxycycline, prednisone, and an inhaler.  He states he finished those medications and had done well, his symptoms had resolved, but shortly after finishing the medications his symptoms started returning.  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  Due to the raspiness in his throat he will cough, and sometimes has yellow sputum production.  His symptoms seem to be worse at night, he has used his albuterol inhaler and Advil with some relief of his symptoms.  He is a non-smoker.  He denies any recent sick contacts or travel.  He denies fevers, chills, ear pain/drainage, CP, SOB, abd pain, N/V/D/C, hematuria, dysuria, myalgias, arthralgias, numbness, tingling, focal weakness, or any other complaints at this time.   The history is provided by the patient and medical records. A language interpreter was used.  Sore Throat  Pertinent negatives include no chest pain, no abdominal pain and no  shortness of breath.    Past Medical History:  Diagnosis Date  . Asthma   . Language barrier to communication     Patient Active Problem List   Diagnosis Date Noted  . Hyperlipidemia LDL goal <100 05/14/2017    History reviewed. No pertinent surgical history.      Home Medications    Prior to Admission medications   Medication Sig Start Date End Date Taking? Authorizing Provider  albuterol (PROVENTIL HFA;VENTOLIN HFA) 108 (90 Base) MCG/ACT inhaler Inhale 1-2 puffs into the lungs every 4 (four) hours as needed for wheezing or shortness of breath. 11/01/17   Mesner, Barbara CowerJason, MD  azithromycin (ZITHROMAX) 250 MG tablet Take 1 tablet (250 mg total) by mouth daily. 1 every day until finished. 10/30/17   Roxy HorsemanBrowning, Robert, PA-C  doxycycline (VIBRAMYCIN) 100 MG capsule Take 1 capsule (100 mg total) by mouth 2 (two) times daily. One po bid x 7 days 11/01/17   Mesner, Barbara CowerJason, MD  predniSONE (DELTASONE) 20 MG tablet 2 tabs po daily x 4 days 11/01/17   Mesner, Barbara CowerJason, MD    Family History No family history on file.  Social History Social History   Tobacco Use  . Smoking status: Never Smoker  . Smokeless tobacco: Never Used  Substance Use Topics  . Alcohol use: Not Currently    Comment: 1 or 2 daily.   . Drug use: No     Allergies   Patient has no known allergies.   Review of Systems Review of Systems  Constitutional: Negative for  chills and fever.  HENT: Positive for rhinorrhea and sore throat. Negative for ear discharge and ear pain.   Respiratory: Positive for cough and wheezing. Negative for shortness of breath.   Cardiovascular: Negative for chest pain.  Gastrointestinal: Negative for abdominal pain, constipation, diarrhea, nausea and vomiting.  Genitourinary: Negative for dysuria and hematuria.  Musculoskeletal: Negative for arthralgias and myalgias.  Skin: Negative for color change.  Allergic/Immunologic: Negative for immunocompromised state.  Neurological: Negative for  weakness and numbness.  Psychiatric/Behavioral: Negative for confusion.   All other systems reviewed and are negative for acute change except as noted in the HPI.    Physical Exam Updated Vital Signs BP 106/73   Pulse (!) 59   Temp 97.8 F (36.6 C) (Oral)   Resp 18   Ht 5\' 4"  (1.626 m)   Wt 76.7 kg (169 lb)   SpO2 97%   BMI 29.01 kg/m    Physical Exam  Constitutional: He is oriented to person, place, and time. Vital signs are normal. He appears well-developed and well-nourished.  Non-toxic appearance. No distress.  Afebrile, nontoxic, NAD  HENT:  Head: Normocephalic and atraumatic.  Nose: Mucosal edema and rhinorrhea present.  Mouth/Throat: Uvula is midline, oropharynx is clear and moist and mucous membranes are normal. No trismus in the jaw. No uvula swelling. Tonsils are 0 on the right. Tonsils are 0 on the left. No tonsillar exudate.  Nose mildly congested with clear rhinorrhea. Oropharynx clear and moist, without uvular swelling or deviation, no trismus or drooling, no tonsillar swelling or erythema, no exudates.    Eyes: Conjunctivae and EOM are normal. Right eye exhibits no discharge. Left eye exhibits no discharge.  Neck: Normal range of motion. Neck supple.  Cardiovascular: Normal rate, regular rhythm, normal heart sounds and intact distal pulses. Exam reveals no gallop and no friction rub.  No murmur heard. Pulmonary/Chest: Effort normal. No respiratory distress. He has no decreased breath sounds. He has wheezes. He has rhonchi. He has no rales.  Diffuse expiratory wheezing throughout with scattered rhonchi, no rales, no hypoxia or increased WOB, speaking in full sentences, SpO2 97% on RA  Abdominal: Soft. Normal appearance and bowel sounds are normal. He exhibits no distension. There is no tenderness. There is no rigidity, no rebound, no guarding, no CVA tenderness, no tenderness at McBurney's point and negative Murphy's sign.  Musculoskeletal: Normal range of motion.    Neurological: He is alert and oriented to person, place, and time. He has normal strength. No sensory deficit.  Skin: Skin is warm, dry and intact. No rash noted.  Psychiatric: He has a normal mood and affect.  Nursing note and vitals reviewed.    ED Treatments / Results  Labs (all labs ordered are listed, but only abnormal results are displayed) Labs Reviewed - No data to display  EKG None  Radiology Dg Chest 2 View  Result Date: 11/22/2017 CLINICAL DATA:  Persistent productive cough and wheezing. EXAM: CHEST - 2 VIEW COMPARISON:  None. FINDINGS: The heart size and mediastinal contours are within normal limits. Both lungs are clear. The visualized skeletal structures are unremarkable. IMPRESSION: No active cardiopulmonary disease. No evidence of pneumonia or pulmonary edema. Electronically Signed   By: Bary Richard M.D.   On: 11/22/2017 13:49    Procedures Procedures (including critical care time)  Medications Ordered in ED Medications  albuterol (PROVENTIL) (2.5 MG/3ML) 0.083% nebulizer solution 5 mg (5 mg Nebulization Given 11/22/17 1335)  ipratropium (ATROVENT) nebulizer solution 0.5 mg (0.5 mg Nebulization  Given 11/22/17 1335)  predniSONE (DELTASONE) tablet 60 mg (60 mg Oral Given 11/22/17 1335)     Initial Impression / Assessment and Plan / ED Course  I have reviewed the triage vital signs and the nursing notes.  Pertinent labs & imaging results that were available during my care of the patient were reviewed by me and considered in my medical decision making (see chart for details).     47 y.o. male here with continued sore throat and cough/URI symptoms since stopping the doxycycline and prednisone he was given at his last visit (11/01/17); that visit, he had CTA chest which showed possible RUL PNA which is why he was given doxycycline/etc. Felt better until finishing the meds, then his cough/wheezing/sore throat returned. 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 On exam, diffuse expiratory wheezing with some scattered rhonchi throughout, throat clear, nose with mild rhinorrhea. Will get CXR and give duoneb and prednisone, then reassess shortly; doubt need for labs at this time.  3:21 PM CXR without evidence of PNA or other concerning findings. Lung sounds greatly improved after breathing tx and pt feeling better. I suspect large component of uncontrolled asthma, likely contributed by allergies, and possible environmental exposures at work. Will send home with inhaler and refill rx, as well as refills of his nebulizer meds, prednisone x4 days starting tomorrow, and claritin. Advised use of other OTC remedies for symptomatic relief. He may warrant upgrading his asthma regimen, perhaps with an inhaled steroid, if these issues continue to persist. F/up with PCP/[facility] in 5-7 days for recheck of symptoms and ongoing management of his asthma. I explained the diagnosis and have given explicit precautions to return to the ER including for any other new or worsening symptoms. The patient understands and accepts the medical plan as it's been dictated and I have answered their questions. Discharge instructions concerning home care and prescriptions have been given. The patient is STABLE and is discharged to home in good condition.    Final Clinical Impressions(s) / ED Diagnoses   Final diagnoses:  Moderate persistent asthma with exacerbation  Sore throat  Cough    ED Discharge Orders        Ordered    loratadine (CLARITIN) 10 MG tablet  Daily     11/22/17 1521    predniSONE (DELTASONE) 20 MG tablet     11/22/17 1521    albuterol (PROVENTIL) (2.5 MG/3ML) 0.083% nebulizer solution  Every 4 hours PRN     11/22/17 1521    albuterol (PROVENTIL HFA;VENTOLIN HFA) 108 (90 Base) MCG/ACT inhaler  Every 4 hours PRN     11/22/17 [address], [place], [place] 11/22/17 1522    Raeford Razor, MD 11/25/17 [phone]

## 2017-11-22 NOTE — ED Triage Notes (Signed)
Patient is spanish speaking. Patient was seen 3 weeks ago for a sore throat and asthma. Patient was prescribed Prednisone and Albuterol inhaler. Patient states he was feeling better when taking his medication and is now out of meds and is still having a productive coough and wheezing at times.  Video Interpreter used- Cristian [id number].

## 2017-11-22 NOTE — ED Notes (Signed)
Translator used on [employer]- Gustavo [phone]#[id number]

## 2017-11-27 ENCOUNTER — Ambulatory Visit: Payer: Self-pay

## 2019-02-17 IMAGING — CT CT ANGIO CHEST
2 of 6 series · 18 of 46 positions shown · IV contrast (ISOVUE)
Comparison: None.

CLINICAL DATA: 46-year-old male with acute cough and shortness of
breath.

EXAM:
CT ANGIOGRAPHY CHEST WITH CONTRAST
TECHNIQUE: Multidetector CT imaging of the chest was performed using the
standard protocol during bolus administration of intravenous
contrast. Multiplanar CT image reconstructions and MIPs were
obtained to evaluate the vascular anatomy.
CONTRAST:  100mL ADVZHW-SCL IOPAMIDOL (ADVZHW-SCL) INJECTION 76%

[Series 5: thins · axial · 0.77mm/px · z∈[-67,+174]mm · 15 of 265 slices shown]
[im 12/265  lung]
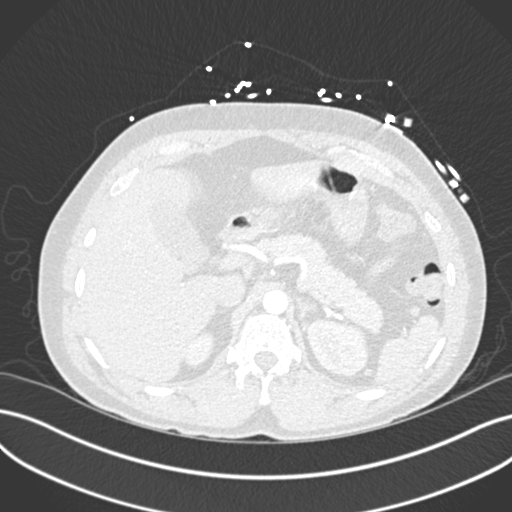
[im 35/265  soft-tissue]
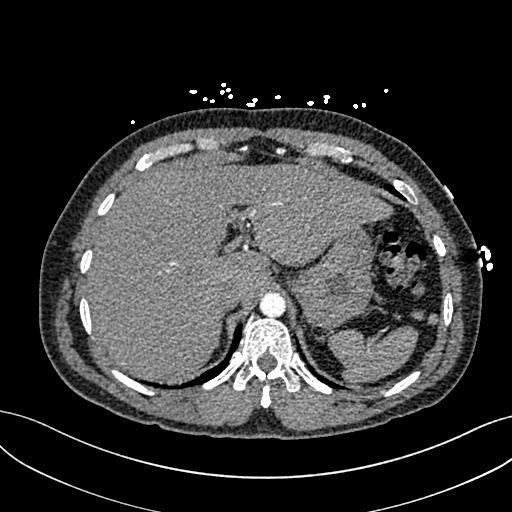
[im 46/265  lung]
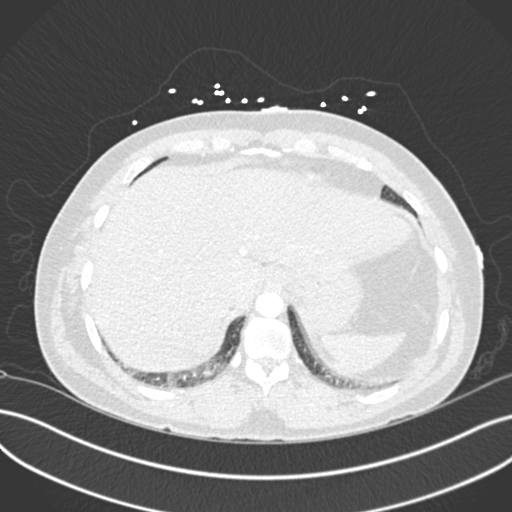
[im 69/265  soft-tissue]
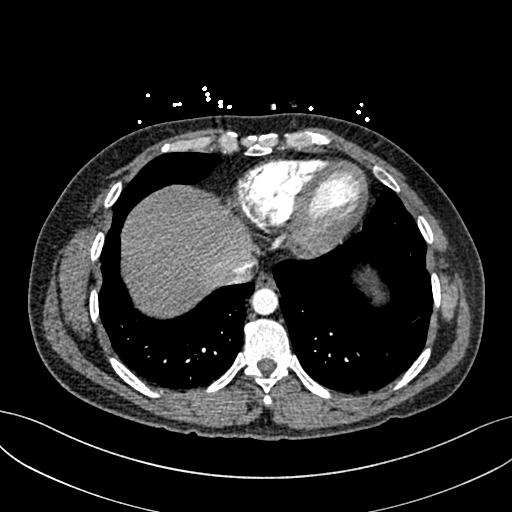
[im 81/265  lung]
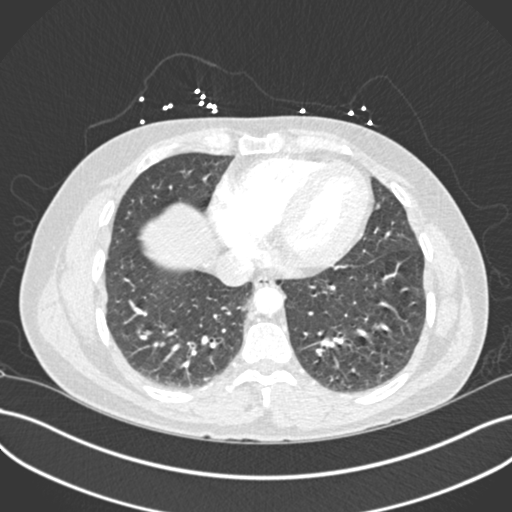
[im 104/265  soft-tissue]
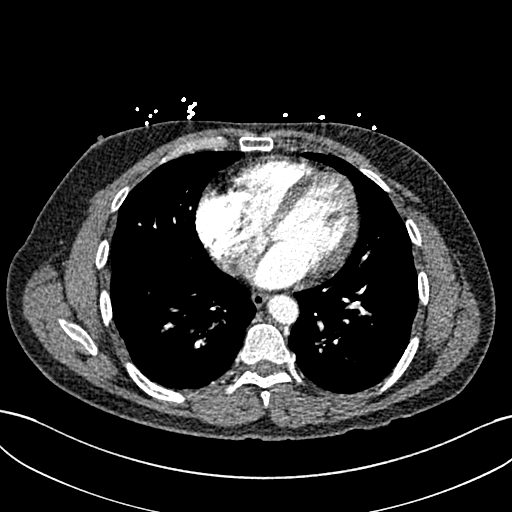
[im 115/265  lung]
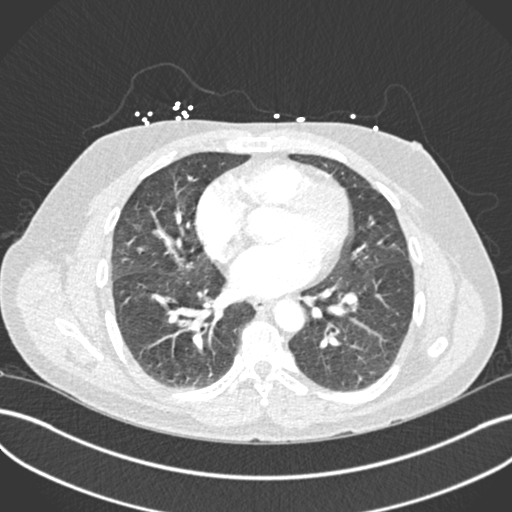
[im 138/265  soft-tissue]
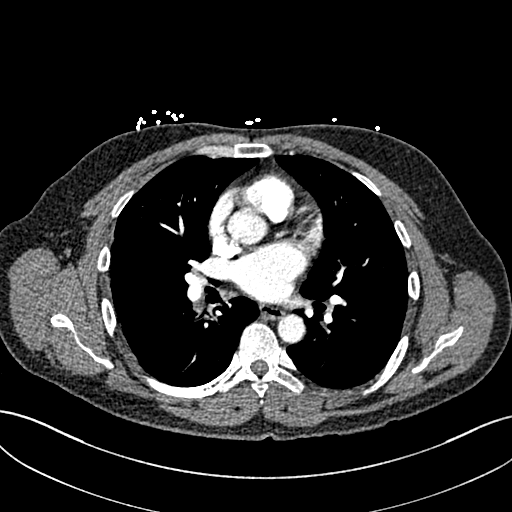
[im 150/265  lung]
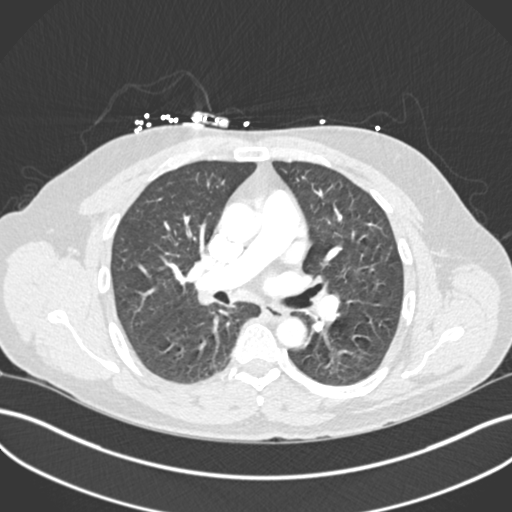
[im 161/265  soft-tissue]
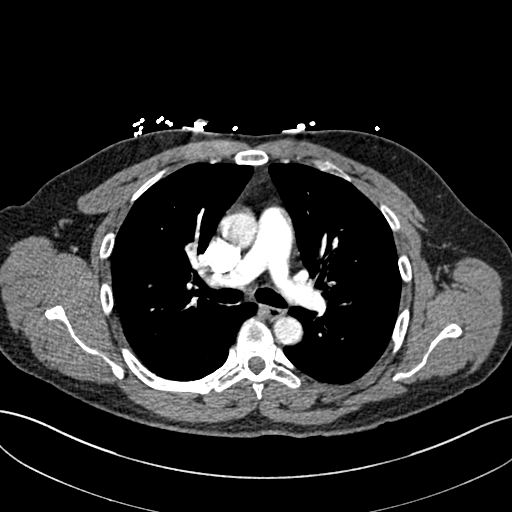
[im 184/265  lung]
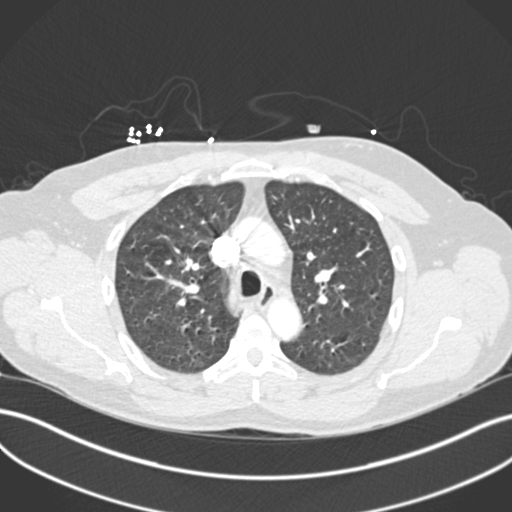
[im 196/265  soft-tissue]
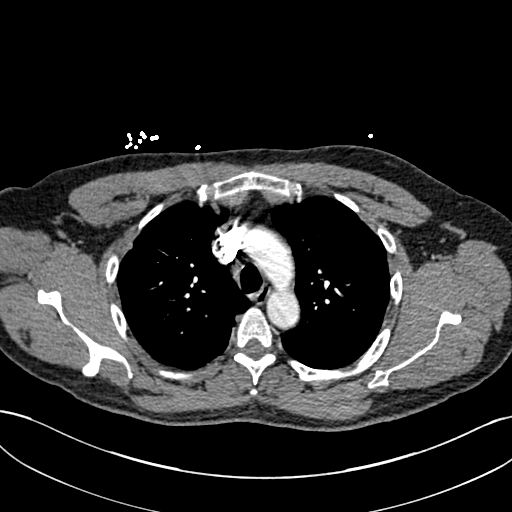
[im 219/265  lung]
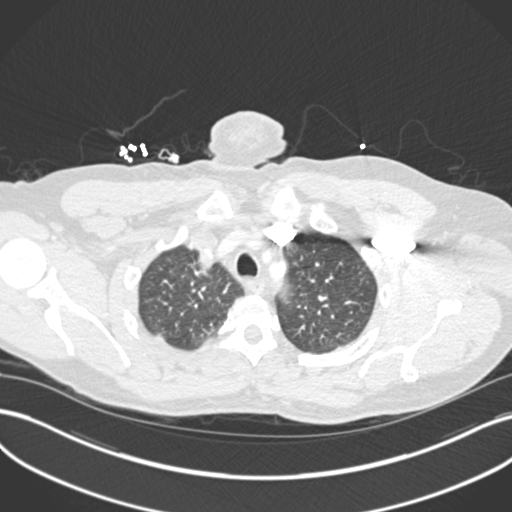
[im 230/265  soft-tissue]
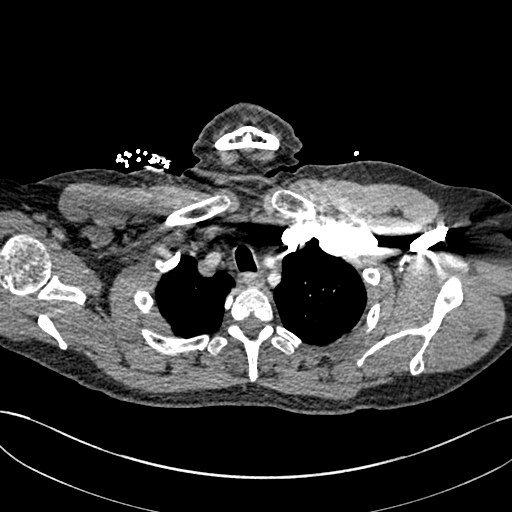
[im 253/265  lung]
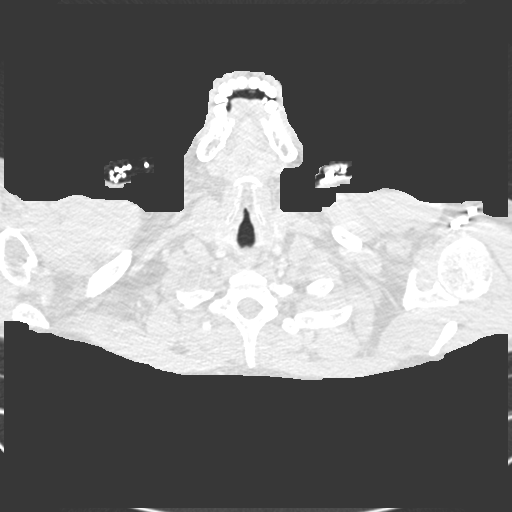

[Series 7: coronal mpr · coronal · 0.54mm/px · 3 of 137 slices shown]
[im 35/137  soft-tissue]
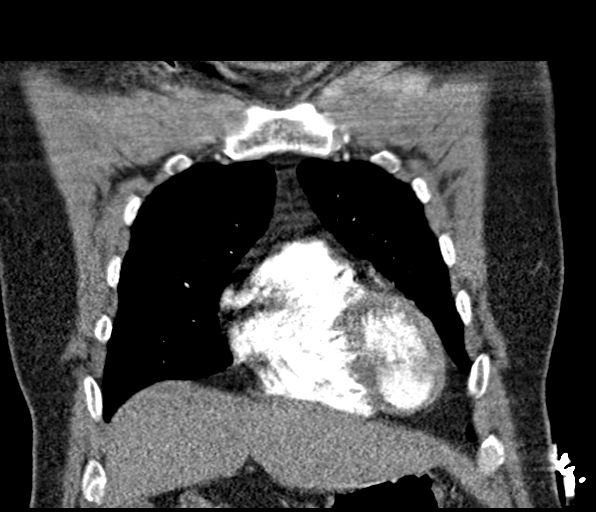
[im 69/137  soft-tissue]
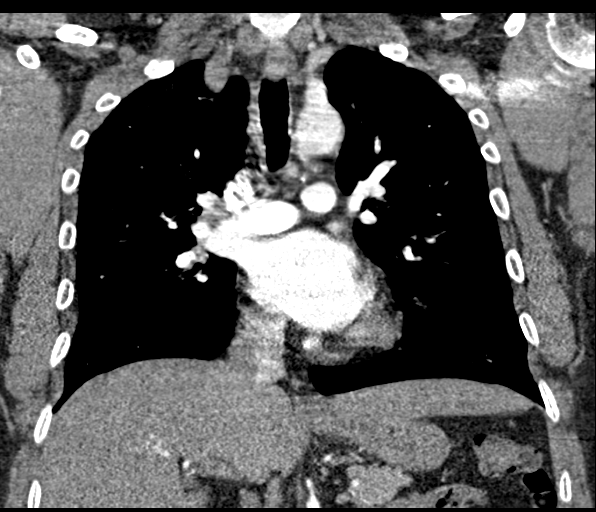
[im 103/137  soft-tissue]
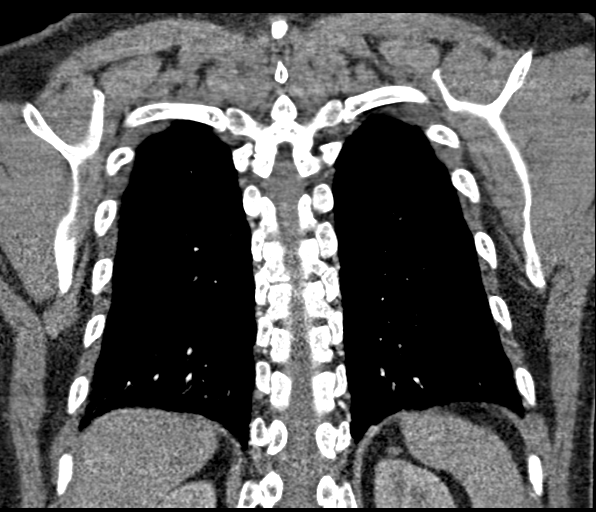

[18 of 46 positions shown; findings below may reference images not displayed]

FINDINGS: Cardiovascular: This study is technically adequate but respiratory
motion artifact decreases sensitivity, specially in the LOWER lungs.
No pulmonary emboli are identified. Heart size normal. No thoracic
aortic aneurysm or pericardial effusion.

Mediastinum/Nodes: No enlarged mediastinal, hilar, or axillary lymph
nodes. Thyroid gland, trachea, and esophagus demonstrate no
significant findings.

Lungs/Pleura: 2 small RIGHT UPPER lobe airspace opacities probably
represent pneumonia. Mild peribronchial thickening identified. No
mass, definite nodule, consolidation, pleural effusion or
pneumothorax.

Upper Abdomen: No acute abnormality.  Hepatic steatosis identified.

Musculoskeletal: No acute or suspicious bony abnormality.

Review of the MIP images confirms the above findings.
IMPRESSION: 1. No evidence of pulmonary emboli
2. Small RIGHT UPPER lobe airspace opacities-question pneumonia.
3. Mild peribronchial thickening
4. Hepatic steatosis

## 2019-11-23 ENCOUNTER — Other Ambulatory Visit: Payer: Self-pay

## 2019-11-23 ENCOUNTER — Encounter (HOSPITAL_COMMUNITY): Payer: Self-pay

## 2019-11-23 ENCOUNTER — Emergency Department (HOSPITAL_COMMUNITY)
Admission: EM | Admit: 2019-11-23 | Discharge: 2019-11-23 | Disposition: A | Discharge status: Home / Self Care | Payer: HRSA Program | Attending: Emergency Medicine | Admitting: Emergency Medicine

## 2019-11-23 ENCOUNTER — Emergency Department (HOSPITAL_COMMUNITY): Payer: HRSA Program

## 2019-11-23 DIAGNOSIS — J45909 Unspecified asthma, uncomplicated: Secondary | ICD-10-CM | POA: Diagnosis not present

## 2019-11-23 DIAGNOSIS — R519 Headache, unspecified: Secondary | ICD-10-CM | POA: Diagnosis not present

## 2019-11-23 DIAGNOSIS — Z87891 Personal history of nicotine dependence: Secondary | ICD-10-CM | POA: Diagnosis not present

## 2019-11-23 DIAGNOSIS — Z7951 Long term (current) use of inhaled steroids: Secondary | ICD-10-CM | POA: Insufficient documentation

## 2019-11-23 DIAGNOSIS — Z20822 Contact with and (suspected) exposure to covid-19: Secondary | ICD-10-CM | POA: Diagnosis not present

## 2019-11-23 DIAGNOSIS — M791 Myalgia, unspecified site: Secondary | ICD-10-CM | POA: Insufficient documentation

## 2019-11-23 DIAGNOSIS — Z792 Long term (current) use of antibiotics: Secondary | ICD-10-CM | POA: Insufficient documentation

## 2019-11-23 DIAGNOSIS — R0602 Shortness of breath: Secondary | ICD-10-CM | POA: Diagnosis present

## 2019-11-23 DIAGNOSIS — R509 Fever, unspecified: Secondary | ICD-10-CM | POA: Insufficient documentation

## 2019-11-23 DIAGNOSIS — U071 COVID-19: Secondary | ICD-10-CM | POA: Diagnosis not present

## 2019-11-23 DIAGNOSIS — J069 Acute upper respiratory infection, unspecified: Secondary | ICD-10-CM

## 2019-11-23 LAB — CBC WITH DIFFERENTIAL/PLATELET
Abs Immature Granulocytes: 0.02 10*3/uL (ref 0.00–0.07)
Basophils Absolute: 0 10*3/uL (ref 0.0–0.1)
Basophils Relative: 0 %
Eosinophils Absolute: 0 10*3/uL (ref 0.0–0.5)
Eosinophils Relative: 0 %
HCT: 48.5 % (ref 39.0–52.0)
Hemoglobin: 16.3 g/dL (ref 13.0–17.0)
Immature Granulocytes: 0 %
Lymphocytes Relative: 29 %
Lymphs Abs: 2.5 10*3/uL (ref 0.7–4.0)
MCH: 32.1 pg (ref 26.0–34.0)
MCHC: 33.6 g/dL (ref 30.0–36.0)
MCV: 95.5 fL (ref 80.0–100.0)
Monocytes Absolute: 0.6 10*3/uL (ref 0.1–1.0)
Monocytes Relative: 7 %
Neutro Abs: 5.3 10*3/uL (ref 1.7–7.7)
Neutrophils Relative %: 64 %
Platelets: 252 10*3/uL (ref 150–400)
RBC: 5.08 MIL/uL (ref 4.22–5.81)
RDW: 12.5 % (ref 11.5–15.5)
WBC: 8.4 10*3/uL (ref 4.0–10.5)
nRBC: 0 % (ref 0.0–0.2)

## 2019-11-23 LAB — POC SARS CORONAVIRUS 2 AG -  ED: SARS Coronavirus 2 Ag: NEGATIVE

## 2019-11-23 LAB — COMPREHENSIVE METABOLIC PANEL
ALT: 47 U/L — ABNORMAL HIGH (ref 0–44)
AST: 55 U/L — ABNORMAL HIGH (ref 15–41)
Albumin: 3.6 g/dL (ref 3.5–5.0)
Alkaline Phosphatase: 111 U/L (ref 38–126)
Anion gap: 12 (ref 5–15)
BUN: 11 mg/dL (ref 6–20)
CO2: 24 mmol/L (ref 22–32)
Calcium: 8.7 mg/dL — ABNORMAL LOW (ref 8.9–10.3)
Chloride: 97 mmol/L — ABNORMAL LOW (ref 98–111)
Creatinine, Ser: 0.82 mg/dL (ref 0.61–1.24)
GFR calc Af Amer: >60 mL/min (ref >60)
GFR calc non Af Amer: >60 mL/min (ref >60)
Glucose, Bld: 145 mg/dL — ABNORMAL HIGH (ref 70–99)
Potassium: 3.7 mmol/L (ref 3.5–5.1)
Sodium: 133 mmol/L — ABNORMAL LOW (ref 135–145)
Total Bilirubin: 1.2 mg/dL (ref 0.3–1.2)
Total Protein: 8.3 g/dL — ABNORMAL HIGH (ref 6.5–8.1)

## 2019-11-23 LAB — SARS CORONAVIRUS 2 (TAT 6-24 HRS): SARS Coronavirus 2: POSITIVE — AB

## 2019-11-23 MED ORDER — KETOROLAC TROMETHAMINE 30 MG/ML IJ SOLN
30.0000 mg | Freq: Once | INTRAMUSCULAR | Status: AC
  Administered 2019-11-23: 30 mg via INTRAMUSCULAR
  Filled 2019-11-23: qty 1

## 2019-11-23 MED ORDER — METOCLOPRAMIDE HCL 5 MG/ML IJ SOLN
10.0000 mg | Freq: Once | INTRAMUSCULAR | Status: AC
  Administered 2019-11-23: 10 mg via INTRAVENOUS

## 2019-11-23 MED ORDER — IPRATROPIUM BROMIDE HFA 17 MCG/ACT IN AERS
2.0000 | INHALATION_SPRAY | Freq: Once | RESPIRATORY_TRACT | Status: AC
  Administered 2019-11-23: 2 via RESPIRATORY_TRACT
  Filled 2019-11-23: qty 12.9

## 2019-11-23 MED ORDER — DOXYCYCLINE HYCLATE 100 MG PO CAPS
100.0000 mg | ORAL_CAPSULE | Freq: Two times a day (BID) | ORAL | 0 refills | Status: AC
Start: 2019-11-23 — End: 2019-11-28

## 2019-11-23 MED ORDER — METOCLOPRAMIDE HCL 5 MG/ML IJ SOLN
10.0000 mg | Freq: Once | INTRAMUSCULAR | Status: DC
  Filled 2019-11-23: qty 2

## 2019-11-23 MED ORDER — ALBUTEROL SULFATE HFA 108 (90 BASE) MCG/ACT IN AERS
4.0000 | INHALATION_SPRAY | Freq: Once | RESPIRATORY_TRACT | Status: AC
  Administered 2019-11-23: 4 via RESPIRATORY_TRACT
  Filled 2019-11-23: qty 6.7

## 2019-11-23 NOTE — Discharge Instructions (Addendum)
Lo vieron hoy por una infeccin de las vas respiratorias superiores, muy probablemente neumona. Trataremos para esto con antibitico. Asegrese de tomar el antibitico con el estmago lleno y complete el curso. Quiero que haga un seguimiento con atencin primaria mientras hablamos sobre esto. Regrese al departamento de emergencias si tiene algn sntoma que empeora o si sus sntomas no desaparecen. Quiero que tome el albuterol dos veces por la maana y dos por la noche durante los prximos [address]. Quiero que tome Atrovent una vez por la noche durante los prximos [address]. Debe realizar un seguimiento con atencin primaria para su asma, niveles elevados de azcar en sangre, TC de cabeza, enzimas hepticas elevadas, como ya comentamos. Quiero que vaya a la atencin primaria anterior o puede encontrar 1 en esta gua. Regrese al departamento de emergencias si tiene sntomas que empeoran, dificultad para Industrial/product designer, Journalist, newspaper, dolor de cabeza que [place]. Puede tomar Tylenol para su dolor de cabeza segn lo prescrito en el frasco. Su prueba de Covid est pendiente, mientras tanto, sus pautas de [employer]. Obtendr [employer] prximos 2 a 3 das. Siga las precauciones de los CDC, lvese las manos puede usar el distanciamiento social.    Mark Love were seen today for upper respiratory infection, most likely pneumonia.  We will treat for this with antibiotic.  Make sure to take the antibiotic on a full stomach and complete the course.  I want you to follow-up with primary care as we discussed about this.  Come back to the emergency department if you have any worsening symptoms or your symptoms do not disappear.  I want you to take the albuterol twice in the morning and twice at night for the next 10 days.  I want you to take the Atrovent once nightly for the next 10 days.  You need to follow-up with primary care for your asthma, elevated blood sugar, CT head, elevated liver enzymes as we  discussed.  I want you to go to the above primary care or you can find 1 on this guide.  Please come back to the emergency department if you have any worsening symptoms, shortness of breath, chest pain, worsening headache.  You can take Tylenol for your headache as prescribed on the bottle.  Your Covid test is pending, you CDC guidelines in the meantime.  You will get these results on MyChart in the next 2 to 3 days.  Follow CDC precautions, wash your hands can use social distancing.

## 2019-11-23 NOTE — ED Provider Notes (Signed)
Medical screening examination/treatment/procedure(s) were conducted as a shared visit with non-physician practitioner(s) and myself.  I personally evaluated the patient during the encounter.    49 year old male presents with asthma headache and fever x8 days.  Work-up here reviewed and patient likely pneumonia.  Head CT negative.  Will place on antibiotics and return precautions given   Lorre Nick, MD 11/23/19 1404

## 2019-11-23 NOTE — ED Provider Notes (Signed)
[facility]-EMERGENCY DEPT Provider Note   CSN: [id number] Arrival date & time: 11/23/19  1021     History Chief Complaint  Patient presents with  . Asthma  . Headache  . Fever  . Generalized Body Aches    Mark Love is a 49 y.o. male with pertinent past medical history of asthma that presents emergency department today for shortness of breath, headache, fever and generalized body aches for the past 8 days.  Patient is Spanish-speaking and interpreter was used.  Patient states that for the past 8 days he has been short of breath, has had asthma before and it feels similar.  States that he has not used inhaler for the past year, patient states that he has not been able to get inhaler..  Patient states that he has never used any asthma medications daily.  Patient states that he also has cough that started 2 days ago, produces sputum.  No hemoptysis.  Patient states that he has chest tightness only when he coughs.  Denies any radiation, chest pain with exertion.  States that this normally occurs when he has asthma.  Has not taken anything for this.  Also admits to fever, has not taken temperature at home.  Also has not taken anything for this.  States that he feels weak with generalized body aches, has not gotten Covid vaccines.  Denies any sick contacts.  States that he has not been eating and drinking normally due to his weakness.  States that he has some vomiting when he needs to cough.  Denies any hematemesis or coffee-ground emesis.  Also admits to headache, 10 out of 10.  States that its worst headache of his life.  Does not normally get headaches.  Is not on any blood thinners.  States that he has some associated dizziness, no vision changes.  Has not tried anything for this.  Has never had any cardiac history. Denies any neck pain when asked, also denies leg swelling, photophobia.   HPI     Past Medical History:  Diagnosis Date  . Asthma   . Language  barrier to communication     Patient Active Problem List   Diagnosis Date Noted  . Hyperlipidemia LDL goal <100 05/14/2017    History reviewed. No pertinent surgical history.     History reviewed. No pertinent family history.  Social History   Tobacco Use  . Smoking status: Never Smoker  . Smokeless tobacco: Former Engineer, waterUser  Substance Use Topics  . Alcohol use: Yes  . Drug use: No    Home Medications Prior to Admission medications   Medication Sig Start Date End Date Taking? Authorizing Provider  albuterol (PROVENTIL HFA;VENTOLIN HFA) 108 (90 Base) MCG/ACT inhaler Inhale 1-2 puffs into the lungs every 4 (four) hours as needed for wheezing or shortness of breath. 11/01/17   Mesner, Barbara CowerJason, MD  albuterol (PROVENTIL HFA;VENTOLIN HFA) 108 (90 Base) MCG/ACT inhaler Inhale 1-2 puffs into the lungs every 4 (four) hours as needed for wheezing or shortness of breath (or cough). 11/22/17   Street, [place], PA-C  albuterol (PROVENTIL) (2.5 MG/3ML) 0.083% nebulizer solution Take 3 mLs (2.5 mg total) by nebulization every 4 (four) hours as needed for wheezing or shortness of breath. 11/22/17   Street, Mercedes, PA-C  azithromycin (ZITHROMAX) 250 MG tablet Take 1 tablet (250 mg total) by mouth daily. 1 every day until finished. 10/30/17   Roxy HorsemanBrowning, Robert, PA-C  doxycycline (VIBRAMYCIN) 100 MG capsule Take 1 capsule (100 mg total) by  mouth 2 (two) times daily for 5 days. 11/23/19 11/28/19  Farrel Gordon, PA-C  loratadine (CLARITIN) 10 MG tablet Take 1 tablet (10 mg total) by mouth daily. One po daily x 5 days 11/22/17   Street, [place], PA-C  predniSONE (DELTASONE) 20 MG tablet 2 tabs po daily x 4 days 11/01/17   Mesner, Barbara Cower, MD  predniSONE (DELTASONE) 20 MG tablet 3 tabs po daily x 4 days STARTING 11/23/17 11/22/17   Street, [place], PA-C    Allergies    Patient has no known allergies.  Review of Systems   Review of Systems  Constitutional: Negative for chills, diaphoresis, fatigue and fever.  HENT:  Negative for congestion, drooling, ear discharge, facial swelling, mouth sores, sinus pressure, sinus pain, sore throat, trouble swallowing and voice change.   Eyes: Negative for pain and visual disturbance.  Respiratory: Positive for cough, chest tightness, shortness of breath and wheezing. Negative for apnea, choking and stridor.   Cardiovascular: Negative for chest pain, palpitations and leg swelling.  Gastrointestinal: Negative for abdominal distention, abdominal pain, diarrhea, nausea and vomiting.  Genitourinary: Negative for difficulty urinating, flank pain and hematuria.  Musculoskeletal: Positive for myalgias. Negative for arthralgias, back pain, gait problem, joint swelling, neck pain and neck stiffness.  Skin: Negative for pallor.  Neurological: Positive for dizziness and headaches. Negative for seizures, syncope, facial asymmetry, speech difficulty, weakness, light-headedness and numbness.  Psychiatric/Behavioral: Negative for confusion.    Physical Exam Updated Vital Signs BP 106/76   Pulse 71   Temp 99.7 F (37.6 C) (Oral)   Resp 20   Ht 5\' 5"  (1.651 m)   Wt 56.7 kg   SpO2 95%   BMI 20.80 kg/m   Physical Exam Constitutional:      General: He is not in acute distress.    Appearance: Normal appearance. He is not ill-appearing, toxic-appearing or diaphoretic.  HENT:     Mouth/Throat:     Mouth: Mucous membranes are moist.     Pharynx: Oropharynx is clear.  Eyes:     General: No visual field deficit or scleral icterus.    Extraocular Movements: Extraocular movements intact.     Pupils: Pupils are equal, round, and reactive to light.  Neck:     Comments: Patient is able to move neck in all directions, without any neck rigidity.  Paraspinal neck tenderness bilaterally.  No midline cervical tenderness. Cardiovascular:     Rate and Rhythm: Normal rate and regular rhythm.     Pulses: Normal pulses.     Heart sounds: Normal heart sounds.  Pulmonary:     Effort:  Pulmonary effort is normal. No respiratory distress.     Breath sounds: No stridor. Wheezing (Mild generalized ) present. No rhonchi or rales.  Chest:     Chest wall: Tenderness (States that it is mild, in the center of his chest ) present.  Abdominal:     General: Abdomen is flat. There is no distension.     Palpations: Abdomen is soft.     Tenderness: There is no abdominal tenderness. There is no guarding or rebound.  Musculoskeletal:        General: Tenderness (Myalgias to sternum, neck, and lower back. No CVA tenderness. No midline tenderness to cervical, thoracic, lumbar spine.) present. No swelling. Normal range of motion.     Cervical back: Normal range of motion and neck supple. No rigidity.     Right lower leg: No edema.     Left lower leg: No edema.  Lymphadenopathy:     Cervical: No cervical adenopathy.  Skin:    General: Skin is warm and dry.     Capillary Refill: Capillary refill takes less than 2 seconds.     Coloration: Skin is not pale.  Neurological:     General: No focal deficit present.     Mental Status: He is alert and oriented to person, place, and time.     Cranial Nerves: No cranial nerve deficit, dysarthria or facial asymmetry.     Sensory: No sensory deficit.     Motor: No weakness.     Coordination: Romberg sign negative. Coordination normal.     Gait: Gait normal.     Comments: .Alert. Clear speech. No facial droop. CNIII-XII grossly intact. Bilateral upper and lower extremities' sensation grossly intact. 5/5 symmetric strength with grip strength and with plantar and dorsi flexion bilaterally.Negative pronator drift. Negative Romberg sign. Gait is steady and intact .   Psychiatric:        Mood and Affect: Mood normal.        Behavior: Behavior normal.     ED Results / Procedures / Treatments   Labs (all labs ordered are listed, but only abnormal results are displayed) Labs Reviewed  COMPREHENSIVE METABOLIC PANEL - Abnormal; Notable for the following  components:      Result Value   Sodium 133 (*)    Chloride 97 (*)    Glucose, Bld 145 (*)    Calcium 8.7 (*)    Total Protein 8.3 (*)    AST 55 (*)    ALT 47 (*)    All other components within normal limits  SARS CORONAVIRUS 2 (TAT 6-24 HRS)  CBC WITH DIFFERENTIAL/PLATELET  POC SARS CORONAVIRUS 2 AG -  ED    EKG None  Radiology CT Head Wo Contrast  Result Date: 11/23/2019 CLINICAL DATA:  Worst headache of life, rule out subarachnoid hemorrhage; headache, acute, normal neuro exam. Additional history provided: Patient with asthma, headache, fever and generalized body aches for 8 days. EXAM: CT HEAD WITHOUT CONTRAST TECHNIQUE: Contiguous axial images were obtained from the base of the skull through the vertex without intravenous contrast. COMPARISON:  No pertinent prior studies available for comparison. FINDINGS: Brain: There is no acute intracranial hemorrhage. No demarcated cortical infarct. No extra-axial fluid collection. No evidence of intracranial mass. No midline shift. Partially empty sella turcica. Vascular: No hyperdense vessel. Skull: Normal. Negative for fracture or focal lesion. Sinuses/Orbits: Visualized orbits show no acute finding. Mild ethmoid sinus mucosal thickening. No significant mastoid effusion. IMPRESSION: Partially empty sella turcica. This is very commonly an incidental finding, but can be associated with idiopathic intracranial hypertension. Otherwise unremarkable CT appearance of the brain. Mild ethmoid sinus mucosal thickening. Electronically Signed   By: Kellie Simmering DO   On: 11/23/2019 12:40   DG Chest Portable 1 View  Result Date: 11/23/2019 CLINICAL DATA:  Shortness of breath, fever EXAM: PORTABLE CHEST 1 VIEW COMPARISON:  11/22/2017 FINDINGS: The heart size and mediastinal contours are within normal limits. Small faint focal opacities within the peripheral aspects of the bilateral upper lobes. No pleural effusion or pneumothorax. The visualized skeletal  structures are unremarkable. IMPRESSION: Small faint focal opacities within the peripheral aspects of the bilateral upper lobes, which may reflect foci of infection or inflammation given the patient's history. Radiographic follow-up to resolution is recommended. Electronically Signed   By: Davina Poke D.O.   On: 11/23/2019 12:23    Procedures Procedures (including critical care time)  Medications Ordered in ED Medications  albuterol (VENTOLIN HFA) 108 (90 Base) MCG/ACT inhaler 4 puff (4 puffs Inhalation Given 11/23/19 1139)  ipratropium (ATROVENT HFA) inhaler 2 puff (2 puffs Inhalation Given 11/23/19 1139)  metoCLOPramide (REGLAN) injection 10 mg (10 mg Intravenous Given 11/23/19 1205)  ketorolac (TORADOL) 30 MG/ML injection 30 mg (30 mg Intramuscular Given 11/23/19 1335)    ED Course  I have reviewed the triage vital signs and the nursing notes.  Pertinent labs & imaging results that were available during my care of the patient were reviewed by me and considered in my medical decision making (see chart for details).    MDM Rules/Calculators/A&P                     Ranard Harte is a 49 y.o. male with pertinent past medical history of asthma that presents emergency department today for shortness of breath, headache, fever and generalized body aches for the past 8 days.  Patient with worst headache of his life, does not normally get headaches.  Not on any blood thinners.  Denies any trauma to head.  Will get CT scan at this time.  Normal neuro exam.  Patient with myalgias and neck, back, sternum, joints.  No neck rigidity.  Do not think LP is necessary at this time.  Patient is afebrile in the emergency department today.  Did not admit to any neck pain when asked, however when pressing on paraspinal muscles of neck he admits to some tenderness.  Full range of motion passively with neck.  We will get basic labs, chest x-ray, Covid swab at this time.  Patient with mild wheezing on exam.   Patient admits to chest tightness.  Unlikely to be ACS, patient with chest tenderness that is reproducible.  No cardiac history.  No radiation, chest tenderness not exertional.  States that it is only present when he coughs.  Abdominal exam benign without any rigidity.  Patient is not nauseous currently.  We will treat headache with metoclopramide, will give Toradol after CT head results. EKG without signs of ischemia.  Labs with stable CBC and CMP.  No white count or anemia noticed.  Patient with elevated glucose to 145 and mildly elevated transaminases, will speak to patient about following up with primary care about this.  Covid test was negative.  Chest x-ray interpreted by me demonstrated small faint focal opacities in the bilateral upper lobes.  Radiologist interpreted this as infection or inflammation with necessary follow-up.  CT had showed empty sella turcica, radiologist noted that this is a very common incidental finding but can also be associated with idiopathic intracranial hypertension.  Discussed this with Dr. Freida Busman.  Does not think we need further work-up.  Will treat for pneumonia and discharge.  Patient states that he feels much better with headache cocktail and headache has resolved.  Patient with repeat exam with no wheezing, patient states that he feels better and is ready for discharge.  Explained incidental findings of CT, need for repeat CXR, and increased transaminases and elevated glucose.  Patient states that he will find a primary care to discuss this.    Explained thoroughly that he needs to have primary care follow-up about asthma and needs to be on daily up medications.  Explained to patient when to take Atrovent and albuterol. Will treat presumptive PNA with Doxy. Doubt need for further emergent work up at this time. I explained the diagnosis and have given explicit precautions to return to the  ER including for any other new or worsening symptoms. The patient understands and  accepts the medical plan as it's been dictated and I have answered their questions. Discharge instructions concerning home care and prescriptions have been given. The patient is STABLE and is discharged to home in good condition.  I discussed this case with my attending physician who cosigned this note including patient's presenting symptoms, physical exam, and planned diagnostics and interventions. Attending physician stated agreement with plan or made changes to plan which were implemented.   Attending physician assessed patient at bedside.  Final Clinical Impression(s) / ED Diagnoses Final diagnoses:  Upper respiratory tract infection, unspecified type    Rx / DC Orders ED Discharge Orders         Ordered    doxycycline (VIBRAMYCIN) 100 MG capsule  2 times daily     11/23/19 1452           Farrel Gordon, PA-C 11/23/19 1659    Lorre Nick, MD 11/30/19 0715

## 2019-11-23 NOTE — ED Triage Notes (Signed)
Patient c/o asthma, headache, fever, and generalized body aches x 8 days.

## 2019-11-24 ENCOUNTER — Telehealth: Payer: Self-pay | Admitting: Unknown Physician Specialty

## 2019-11-24 ENCOUNTER — Telehealth (HOSPITAL_COMMUNITY): Payer: Self-pay

## 2019-11-24 NOTE — Telephone Encounter (Signed)
Called to discuss with Mark Love about Covid symptoms and the use of bamlanivimab, a monoclonal antibody infusion for those with mild to moderate Covid symptoms and at a high risk of hospitalization.     Pt is qualified for this infusion at the [facility] infusion center due to co-morbid conditions and/or a member of an at-risk group, however declines infusion at this time.  He is currently a another clinic seeking help.

## 2019-12-14 ENCOUNTER — Other Ambulatory Visit: Payer: Self-pay

## 2019-12-14 ENCOUNTER — Encounter (HOSPITAL_COMMUNITY): Payer: Self-pay

## 2019-12-14 ENCOUNTER — Emergency Department (HOSPITAL_COMMUNITY)
Admission: EM | Admit: 2019-12-14 | Discharge: 2019-12-14 | Disposition: A | Discharge status: Home / Self Care | Payer: Self-pay | Attending: Emergency Medicine | Admitting: Emergency Medicine

## 2019-12-14 DIAGNOSIS — Z87891 Personal history of nicotine dependence: Secondary | ICD-10-CM | POA: Insufficient documentation

## 2019-12-14 DIAGNOSIS — J9801 Acute bronchospasm: Secondary | ICD-10-CM

## 2019-12-14 DIAGNOSIS — R059 Cough, unspecified: Secondary | ICD-10-CM

## 2019-12-14 DIAGNOSIS — R05 Cough: Secondary | ICD-10-CM | POA: Insufficient documentation

## 2019-12-14 DIAGNOSIS — R519 Headache, unspecified: Secondary | ICD-10-CM | POA: Insufficient documentation

## 2019-12-14 DIAGNOSIS — Z8701 Personal history of pneumonia (recurrent): Secondary | ICD-10-CM | POA: Insufficient documentation

## 2019-12-14 MED ORDER — NAPROXEN 375 MG PO TABS
375.0000 mg | ORAL_TABLET | Freq: Two times a day (BID) | ORAL | 0 refills | Status: DC
Start: 2019-12-14 — End: 2020-01-14

## 2019-12-14 MED ORDER — BENZONATATE 100 MG PO CAPS
200.0000 mg | ORAL_CAPSULE | Freq: Two times a day (BID) | ORAL | 0 refills | Status: DC | PRN
Start: 2019-12-14 — End: 2020-01-14

## 2019-12-14 MED FILL — BENZONATATE 100 MG CAPS: 100 | 5 days supply | Qty: 20 | Fill #0

## 2019-12-14 MED FILL — NAPROXEN 375 MG TABLET: 375 | 10 days supply | Qty: 20 | Fill #0

## 2019-12-14 NOTE — ED Notes (Signed)
Social worker and interpreter at bedside speaking with patient.

## 2019-12-14 NOTE — ED Notes (Signed)
Spoke with social work. Patients medications will be sent to the [facility] for a one time refill. Instructions will be explained using an interpreter by Child psychotherapist.

## 2019-12-14 NOTE — Progress Notes (Signed)
TOC CM contacted Spanish Interpreter, Raynelle Fanning. TOC CM and Raynelle Fanning at bedside. Explained to pt to pick up meds for [facility] with a one time free fill. Pt is requesting a note for work. Will schedule appt at [facility] for pt to follow up. Spanish Interpreter will call pt to give appt time. Isidoro Donning RN CCM, [facility] ED TOC CM [phone]

## 2019-12-14 NOTE — ED Notes (Signed)
Per chart Patient covid Positive earlier this month. Patient placed on airborne precautions.

## 2019-12-14 NOTE — ED Notes (Signed)
[facility] interpreter at bedside. Patient speaks Spanish.

## 2019-12-14 NOTE — ED Provider Notes (Signed)
[facility]-EMERGENCY DEPT Provider Note   CSN: [id number] Arrival date & time: 12/14/19  3716     History Chief Complaint  Patient presents with  . Cough  . Headache    Mark Love is avery pleasant 49 y.o. male who presents emergency department with chief complaint of cough. There is a language barrier and professional translation services are utilized. He was seen for the same on 11/23/2019, diagnosed with pneumonia, treated with doxycycline and albuterol. His symptoms improved significantly. He continues to have a daily cough and headache which he feels is associated with the cough. His myalgias have resolved. His wheezing is minimal and well controlled with his albuterol inhaler. The patient predominantly is asking for a note that he can present to social services for help with his income since he was out of work for 3 weeks. He is also asking for a note to return to work. He states that he only has about $10 which he needs to put into his gas tank and is also hoping he can get some medication assistance. He denies neck stiffness, fevers. He denies photophobia, phonophobia. He has not been taking Tylenol or other medications for headache. HPI     Past Medical History:  Diagnosis Date  . Asthma   . Language barrier to communication     Patient Active Problem List   Diagnosis Date Noted  . Hyperlipidemia LDL goal <100 05/14/2017    History reviewed. No pertinent surgical history.     History reviewed. No pertinent family history.  Social History   Tobacco Use  . Smoking status: Never Smoker  . Smokeless tobacco: Former Clinical biochemist  . Vaping Use: Never used  Substance Use Topics  . Alcohol use: Yes  . Drug use: No    Home Medications Prior to Admission medications   Medication Sig Start Date End Date Taking? Authorizing Provider  albuterol (PROVENTIL HFA;VENTOLIN HFA) 108 (90 Base) MCG/ACT inhaler Inhale 1-2 puffs into the lungs  every 4 (four) hours as needed for wheezing or shortness of breath. 11/01/17   Mesner, Barbara Cower, MD  albuterol (PROVENTIL HFA;VENTOLIN HFA) 108 (90 Base) MCG/ACT inhaler Inhale 1-2 puffs into the lungs every 4 (four) hours as needed for wheezing or shortness of breath (or cough). 11/22/17   Street, [place], PA-C  albuterol (PROVENTIL) (2.5 MG/3ML) 0.083% nebulizer solution Take 3 mLs (2.5 mg total) by nebulization every 4 (four) hours as needed for wheezing or shortness of breath. 11/22/17   Street, Mercedes, PA-C  azithromycin (ZITHROMAX) 250 MG tablet Take 1 tablet (250 mg total) by mouth daily. 1 every day until finished. 10/30/17   Roxy Horseman, PA-C  loratadine (CLARITIN) 10 MG tablet Take 1 tablet (10 mg total) by mouth daily. One po daily x 5 days 11/22/17   Street, [place], PA-C  predniSONE (DELTASONE) 20 MG tablet 2 tabs po daily x 4 days 11/01/17   Mesner, Barbara Cower, MD  predniSONE (DELTASONE) 20 MG tablet 3 tabs po daily x 4 days STARTING 11/23/17 11/22/17   Street, [place], PA-C    Allergies    Patient has no known allergies.  Review of Systems   Review of Systems Ten systems reviewed and are negative for acute change, except as noted in the HPI.   Physical Exam Updated Vital Signs BP (!) 131/93 (BP Location: Left Arm)   Pulse 67   Temp 98.6 F (37 C) (Oral)   Resp 18   Ht 5\' 5"  (1.651 m)  Wt 56.7 kg   SpO2 96%   BMI 20.80 kg/m   Physical Exam Vitals and nursing note reviewed.  Constitutional:      General: He is not in acute distress.    Appearance: He is well-developed. He is not diaphoretic.  HENT:     Head: Normocephalic and atraumatic.  Eyes:     General: No scleral icterus.    Conjunctiva/sclera: Conjunctivae normal.  Cardiovascular:     Rate and Rhythm: Normal rate and regular rhythm.     Heart sounds: Normal heart sounds.  Pulmonary:     Effort: Pulmonary effort is normal. No respiratory distress.     Breath sounds: Normal breath sounds. No wheezing or rhonchi.    Abdominal:     Palpations: Abdomen is soft.     Tenderness: There is no abdominal tenderness.  Musculoskeletal:     Cervical back: Normal range of motion and neck supple.  Skin:    General: Skin is warm and dry.  Neurological:     Mental Status: He is alert.  Psychiatric:        Behavior: Behavior normal.     ED Results / Procedures / Treatments   Labs (all labs ordered are listed, but only abnormal results are displayed) Labs Reviewed - No data to display  EKG None  Radiology No results found.  Procedures Procedures (including critical care time)  Medications Ordered in ED Medications - No data to display  ED Course  I have reviewed the triage vital signs and the nursing notes.  Pertinent labs & imaging results that were available during my care of the patient were reviewed by me and considered in my medical decision making (see chart for details).    MDM Rules/Calculators/A&P                          49 year old male with residual cough after pneumonia. He has clear lung sounds, afebrile and hemodynamically stable today. I do not feel that he needs repeat imaging at this time. I will provide the patient with to notes, 1 stating that he was sick with pneumonia and respiratory issues and unable to work and another that states he is safe to return to work tomorrow. I placed a social work consult for medication assistance.  Final Clinical Impression(s) / ED Diagnoses Final diagnoses:  None    Rx / DC Orders ED Discharge Orders    None       Arthor Captain, PA-C 12/14/19 1750    Linwood Dibbles, MD 12/15/19 [phone]

## 2019-12-14 NOTE — Discharge Instructions (Signed)
Comunquese con un mdico si: Tiene dolores musculares. Siente dolor en el pecho. El color de la mucosidad que elimina (esputo) cambia de trasparente o blanco a [place], [place], [place], o nota sangre en el esputo. Tiene fiebre. El esputo se torna ms espeso. Solicite ayuda de inmediato si: Empeoran las sibilancias y la tos, an despus de tomar los medicamentos recetados. Se le hace ms difcil respirar. Siente un dolor intenso en el pecho.

## 2019-12-14 NOTE — ED Triage Notes (Signed)
Patient c/o a non productive cough and headache x 3 weeks. Patient was seen in the ED at that time with the same complaints

## 2020-01-14 ENCOUNTER — Ambulatory Visit: Payer: Self-pay | Attending: Internal Medicine | Admitting: Internal Medicine

## 2020-01-14 ENCOUNTER — Other Ambulatory Visit: Payer: Self-pay

## 2020-01-14 DIAGNOSIS — J4520 Mild intermittent asthma, uncomplicated: Secondary | ICD-10-CM

## 2020-01-14 DIAGNOSIS — J189 Pneumonia, unspecified organism: Secondary | ICD-10-CM

## 2020-01-14 NOTE — Progress Notes (Signed)
Virtual Visit via Telephone Note Due to current restrictions/limitations of in-office visits due to the COVID-19 pandemic, this scheduled clinical appointment was converted to a telehealth visit  I connected with Mark Love on 01/14/20 at 4:30 p.m by telephone and verified that I am speaking with the correct person using two identifiers. I am in my office.  The patient is at home.  Only the patient, myself and Gardiner Ramus from [employer] [phone]) participated in this encounter.    I discussed the limitations, risks, security and privacy concerns of performing an evaluation and management service by telephone and the availability of in person appointments. I also discussed with the patient that there may be a patient responsible charge related to this service. The patient expressed understanding and agreed to proceed.   History of Present Illness: Patient with history of asthma, empty sella turcica. Pt to est care. Seen in ER x 2 last mth with flare of asthma and pneumonia.  Pt treated.  Reports he is feeling better and just returned to work after being out for 3-4 wks.  He has Albuterol inhaler and mneb but states he has not had to use it recently.   No cough or SOB at this time.  Social History   Socioeconomic History  . Marital status: Single    Spouse name: Not on file  . Number of children: Not on file  . Years of education: Not on file  . Highest education level: Not on file  Occupational History  . Not on file  Tobacco Use  . Smoking status: Never Smoker  . Smokeless tobacco: Former Clinical biochemist  . Vaping Use: Never used  Substance and Sexual Activity  . Alcohol use: Yes  . Drug use: No  . Sexual activity: Not on file  Other Topics Concern  . Not on file  Social History Narrative   ** Merged History Encounter **       ** Merged History Encounter **       Social Determinants of Health   Financial Resource Strain:   . Difficulty of Paying Living  Expenses:   Food Insecurity:   . Worried About Programme researcher, broadcasting/film/video in the Last Year:   . Barista in the Last Year:   Transportation Needs:   . Freight forwarder (Medical):   Marland Kitchen Lack of Transportation (Non-Medical):   Physical Activity:   . Days of Exercise per Week:   . Minutes of Exercise per Session:   Stress:   . Feeling of Stress :   Social Connections:   . Frequency of Communication with Friends and Family:   . Frequency of Social Gatherings with Friends and Family:   . Attends Religious Services:   . Active Member of Clubs or Organizations:   . Attends Banker Meetings:   Marland Kitchen Marital Status:       Outpatient Encounter Medications as of 01/14/2020  Medication Sig Note  . albuterol (PROVENTIL HFA;VENTOLIN HFA) 108 (90 Base) MCG/ACT inhaler Inhale 1-2 puffs into the lungs every 4 (four) hours as needed for wheezing or shortness of breath. (Patient not taking: Reported on 01/14/2020)   . albuterol (PROVENTIL HFA;VENTOLIN HFA) 108 (90 Base) MCG/ACT inhaler Inhale 1-2 puffs into the lungs every 4 (four) hours as needed for wheezing or shortness of breath (or cough). (Patient not taking: Reported on 01/14/2020)   . albuterol (PROVENTIL) (2.5 MG/3ML) 0.083% nebulizer solution Take 3 mLs (2.5 mg total) by nebulization every  4 (four) hours as needed for wheezing or shortness of breath. (Patient not taking: Reported on 01/14/2020)   . [DISCONTINUED] azithromycin (ZITHROMAX) 250 MG tablet Take 1 tablet (250 mg total) by mouth daily. 1 every day until finished. (Patient not taking: Reported on 01/14/2020) 11/01/2017: ABT Start Date 10/31/17 & End Date 11/04/17.  . [DISCONTINUED] benzonatate (TESSALON) 100 MG capsule Take 2 capsules (200 mg total) by mouth 2 (two) times daily as needed for cough. (Patient not taking: Reported on 01/14/2020)   . [DISCONTINUED] loratadine (CLARITIN) 10 MG tablet Take 1 tablet (10 mg total) by mouth daily. One po daily x 5 days (Patient not taking:  Reported on 01/14/2020)   . [DISCONTINUED] naproxen (NAPROSYN) 375 MG tablet Take 1 tablet (375 mg total) by mouth 2 (two) times daily. (Patient not taking: Reported on 01/14/2020)   . [DISCONTINUED] predniSONE (DELTASONE) 20 MG tablet 2 tabs po daily x 4 days (Patient not taking: Reported on 01/14/2020)   . [DISCONTINUED] predniSONE (DELTASONE) 20 MG tablet 3 tabs po daily x 4 days STARTING 11/23/17 (Patient not taking: Reported on 01/14/2020)    No facility-administered encounter medications on file as of 01/14/2020.      Observations/Objective: No direct observation done as this was a telephone encounter.  Assessment and Plan: 1. Mild intermittent asthma without complication Patient advised to always keep albuterol inhaler with him to use when needed.  He will follow-up with [place] as needed.  2. Community acquired pneumonia, unspecified laterality Resolved.   Follow Up Instructions: As needed   I discussed the assessment and treatment plan with the patient. The patient was provided an opportunity to ask questions and all were answered. The patient agreed with the plan and demonstrated an understanding of the instructions.   The patient was advised to call back or seek an in-person evaluation if the symptoms worsen or if the condition fails to improve as anticipated.  I provided 15  minutes of non-face-to-face time during this encounter.   Jonah Blue, MD

## 2021-06-24 ENCOUNTER — Emergency Department (HOSPITAL_COMMUNITY)
Admission: EM | Admit: 2021-06-24 | Discharge: 2021-06-24 | Disposition: A | Discharge status: Home / Self Care | Payer: Self-pay | Attending: Emergency Medicine | Admitting: Emergency Medicine

## 2021-06-24 ENCOUNTER — Other Ambulatory Visit: Payer: Self-pay

## 2021-06-24 ENCOUNTER — Other Ambulatory Visit (HOSPITAL_COMMUNITY): Payer: Self-pay

## 2021-06-24 ENCOUNTER — Encounter (HOSPITAL_COMMUNITY): Payer: Self-pay

## 2021-06-24 ENCOUNTER — Emergency Department (HOSPITAL_COMMUNITY): Payer: Self-pay

## 2021-06-24 DIAGNOSIS — Z20822 Contact with and (suspected) exposure to covid-19: Secondary | ICD-10-CM | POA: Insufficient documentation

## 2021-06-24 DIAGNOSIS — J069 Acute upper respiratory infection, unspecified: Secondary | ICD-10-CM | POA: Insufficient documentation

## 2021-06-24 DIAGNOSIS — J45909 Unspecified asthma, uncomplicated: Secondary | ICD-10-CM | POA: Insufficient documentation

## 2021-06-24 LAB — RESP PANEL BY RT-PCR (FLU A&B, COVID) ARPGX2
Influenza A by PCR: NEGATIVE
Influenza B by PCR: NEGATIVE
SARS Coronavirus 2 by RT PCR: NEGATIVE

## 2021-06-24 MED ORDER — DEXAMETHASONE SODIUM PHOSPHATE 10 MG/ML IJ SOLN
10.0000 mg | Freq: Once | INTRAMUSCULAR | Status: AC
  Administered 2021-06-24: 10 mg via INTRAMUSCULAR
  Filled 2021-06-24: qty 1

## 2021-06-24 MED ORDER — BENZONATATE 100 MG PO CAPS
100.0000 mg | ORAL_CAPSULE | Freq: Three times a day (TID) | ORAL | 0 refills | Status: AC | PRN
  Filled 2021-06-24: qty 21, 7d supply, fill #0

## 2021-06-24 MED ORDER — ALBUTEROL SULFATE HFA 108 (90 BASE) MCG/ACT IN AERS
2.0000 | INHALATION_SPRAY | Freq: Once | RESPIRATORY_TRACT | Status: AC
  Administered 2021-06-24: 2 via RESPIRATORY_TRACT
  Filled 2021-06-24: qty 6.7

## 2021-06-24 MED ORDER — FLUTICASONE PROPIONATE 50 MCG/ACT NA SUSP
2.0000 | Freq: Every day | NASAL | 0 refills | Status: AC
  Filled 2021-06-24: qty 16, 30d supply, fill #0

## 2021-06-24 NOTE — ED Provider Notes (Signed)
Emergency Department Provider Note   I have reviewed the triage vital signs and the nursing notes.   HISTORY  Chief Complaint Cough  Video Spanish interpreter (Ibon) used for the encounter.   HPI Mark Love is a 51 y.o. male with past medical history of asthma presents to the emergency department with coughing flulike symptoms over the past 8 days.  He denies any pain in his chest or wheezing.  He states has had sore throat along with congestion and cough which is nonproductive.  No fevers or chills.  He states over the past several days is actually feeling somewhat better.  He was post to go to work today but did not feel well enough to do so and so presents to the emergency department.  He is not having chest or abdominal pain.  No radiation of symptoms or other modifying factors.   Past Medical History:  Diagnosis Date   Asthma    Language barrier to communication     Review of Systems  Constitutional: No fever/chills. Positive fatigue.  Eyes: No visual changes. ENT: Positive sore throat. Cardiovascular: Denies chest pain. Respiratory: Denies shortness of breath. Positive cough.  Gastrointestinal: No abdominal pain.  No nausea, no vomiting.  No diarrhea.  No constipation. Genitourinary: Negative for dysuria. Musculoskeletal: Negative for back pain. Skin: Negative for rash. Neurological: Negative for focal weakness or numbness. Positive HA.   10-point ROS otherwise negative.  ____________________________________________   PHYSICAL EXAM:  VITAL SIGNS: ED Triage Vitals  Enc Vitals Group     BP 06/24/21 0655 (!) 135/92     Pulse Rate 06/24/21 0655 75     Resp 06/24/21 0655 16     Temp 06/24/21 0655 98.7 F (37.1 C)     Temp Source 06/24/21 0655 Oral     SpO2 06/24/21 0655 97 %     Weight 06/24/21 0655 125 lb (56.7 kg)     Height 06/24/21 0655 5\' 5"  (1.651 m)   Constitutional: Alert and oriented. Well appearing and in no acute distress. Eyes:  Conjunctivae are normal.  Head: Atraumatic. Nose: No congestion/rhinnorhea. Mouth/Throat: Mucous membranes are moist.  Oropharynx with mild erythema. No exudate. No PTA.  Neck: No stridor.   Cardiovascular: Normal rate, regular rhythm. Good peripheral circulation. Grossly normal heart sounds.   Respiratory: Normal respiratory effort.  No retractions. Lungs CTAB. Gastrointestinal:  No distention.  Musculoskeletal:  No gross deformities of extremities. Neurologic:  Normal speech and language.  Skin:  Skin is warm, dry and intact. No rash noted.  ____________________________________________   LABS (all labs ordered are listed, but only abnormal results are displayed)  Labs Reviewed  RESP PANEL BY RT-PCR (FLU A&B, COVID) [id number]   ____________________________________________  RADIOLOGY  DG Chest 2 View  Result Date: 06/24/2021 CLINICAL DATA:  51 year old male with cough and sore throat.  Fever. EXAM: CHEST - 2 VIEW COMPARISON:  Portable chest 11/23/2019 and earlier. FINDINGS: Lung volumes and mediastinal contours are stable since 2019 and within normal limits. Visualized tracheal air column is within normal limits. Mild diffuse increased pulmonary interstitial markings are stable since 2019. No pneumothorax, pulmonary edema, pleural effusion or confluent pulmonary opacity. No acute osseous abnormality identified. Negative visible bowel gas. IMPRESSION: Stable since 2019.  No acute cardiopulmonary abnormality. Electronically Signed   By: 2020 M.D.   On: 06/24/2021 07:20    ____________________________________________   PROCEDURES  Procedure(s) performed:   Procedures  None  ____________________________________________   INITIAL IMPRESSION / ASSESSMENT AND PLAN /  ED COURSE  Pertinent labs & imaging results that were available during my care of the patient were reviewed by me and considered in my medical decision making (see chart for details).    The Differential Diagnoses  include COVID, Flu, RSV, CAP.  Medical Decision Making: Summary:  Patient is overall very well-appearing 51 year old male presents with cough and congestion with sore throat over the past 8 days.  His COVID and flu PCR test here coming back negative.  His x-ray, independently interpreted by me, shows no evidence of pneumonia, effusion, pneumothorax.    I decided to review pertinent External Data, and in summary patient has not had a recent ED visit or PCP visit in our system.    Clinical Laboratory Tests Ordered, included COVID/Flu PCR when are negative for both.   Radiologic Tests Ordered, included chest x-ray which I independently reviewed and do not appreciate any infiltrate, effusion, edema.  ____________________________________________  FINAL CLINICAL IMPRESSION(S) / ED DIAGNOSES  Final diagnoses:  Viral URI with cough     MEDICATIONS GIVEN DURING THIS VISIT:  Medications  dexamethasone (DECADRON) injection 10 mg (10 mg Intramuscular Given 06/24/21 0900)  albuterol (VENTOLIN HFA) 108 (90 Base) MCG/ACT inhaler 2 puff (2 puffs Inhalation Given 06/24/21 0900)     NEW OUTPATIENT MEDICATIONS STARTED DURING THIS VISIT:  Discharge Medication List as of 06/24/2021  8:41 AM     START taking these medications   Details  benzonatate (TESSALON) 100 MG capsule Take 1 capsule (100 mg total) by mouth 3 (three) times daily as needed for cough., Starting Sat 06/24/2021, Print    fluticasone (FLONASE) 50 MCG/ACT nasal spray Place 2 sprays into both nostrils daily for 7 days., Starting Sat 06/24/2021, Until Sat 07/01/2021, Print        Note:  This document was prepared using Dragon voice recognition software and may include unintentional dictation errors.  Alona Bene, MD, [facility] Emergency Medicine    Kaleiah Kutzer, Arlyss Repress, MD 06/24/21 [phone]

## 2021-06-24 NOTE — ED Triage Notes (Signed)
Interpreter used for triage.   Pt reports cough, sore throat, generalized body aches x 8 days. Had a fever 3 days ago but feeling a little better.

## 2021-06-24 NOTE — ED Notes (Signed)
Pt instructed and demonstrated on using an inhaler, all questions answered prior to discharge.

## 2021-06-24 NOTE — ED Provider Triage Note (Signed)
Emergency Medicine Provider Triage Evaluation Note  Mark Love , a 51 y.o. male  was evaluated in triage.  Pt complains of 1 week of cough, congestion and sore throat.  Patient reports symptoms or not improving and he is now coughing up some phlegm.  Have a fever 3 days ago.  Only has mild chest discomfort when coughing.  Review of Systems  Positive: Cough, congestion, sore throat, fever Negative: Shortness of breath  Physical Exam  BP (!) 135/92 (BP Location: Right Arm)    Pulse 75    Temp 98.7 F (37.1 C) (Oral)    Resp 16    Ht 5\' 5"  (1.651 m)    Wt 56.7 kg    SpO2 97%    BMI 20.80 kg/m  Gen:   Awake, no distress   Resp:  Normal effort, CTA bilat MSK:   Moves extremities without difficulty  Other:    Medical Decision Making  Medically screening exam initiated at 7:13 AM.  Appropriate orders placed.  Mark Love was informed that the remainder of the evaluation will be completed by another provider, this initial triage assessment does not replace that evaluation, and the importance of remaining in the ED until their evaluation is complete.  Patient is well-appearing, will check COVID test and chest x-ray.   , Mark Love 06/24/21 [phone]

## 2021-06-25 ENCOUNTER — Emergency Department (HOSPITAL_COMMUNITY)
Admission: EM | Admit: 2021-06-25 | Discharge: 2021-06-25 | Disposition: A | Discharge status: Home / Self Care | Payer: Self-pay | Attending: Emergency Medicine | Admitting: Emergency Medicine

## 2021-06-25 ENCOUNTER — Other Ambulatory Visit: Payer: Self-pay

## 2021-06-25 DIAGNOSIS — J069 Acute upper respiratory infection, unspecified: Secondary | ICD-10-CM | POA: Insufficient documentation

## 2021-06-25 DIAGNOSIS — Z7951 Long term (current) use of inhaled steroids: Secondary | ICD-10-CM | POA: Insufficient documentation

## 2021-06-25 DIAGNOSIS — Z79899 Other long term (current) drug therapy: Secondary | ICD-10-CM | POA: Insufficient documentation

## 2021-06-25 MED ORDER — GUAIFENESIN 200 MG PO TABS
200.0000 mg | ORAL_TABLET | ORAL | 0 refills | Status: DC | PRN
  Filled 2021-06-25: qty 30, 5d supply, fill #0

## 2021-06-25 MED ORDER — IPRATROPIUM-ALBUTEROL 0.5-2.5 (3) MG/3ML IN SOLN
3.0000 mL | Freq: Once | RESPIRATORY_TRACT | Status: AC
  Administered 2021-06-25: 3 mL via RESPIRATORY_TRACT
  Filled 2021-06-25: qty 3

## 2021-06-25 NOTE — ED Notes (Signed)
Discharge paperwork reviewed and questions answered using interpreter Byrd Hesselbach.

## 2021-06-25 NOTE — ED Triage Notes (Addendum)
51 yo male presents c/o cough. Pt was seen yesterday in ED  for same symptoms but pt states he came back because he did not understand how to use the medications that were ordered. Nurse has reiterated to pt proper use of the cough medicine ordered. Pt is also requesting something stronger for the cough. Nurse explained to pt that because the medication was only taken once and it was improper dose he should try using the medication the correct way. Pt is now awaiting provider. Pt is also requesting nebulizer treatment because he states last time he was sick it was helpful.    Interpreter sandy [phone] used for triage

## 2021-06-25 NOTE — ED Provider Notes (Signed)
[facility]-EMERGENCY DEPT Provider Note   CSN: [id number] Arrival date & time: 06/25/21  1153     History  Chief Complaint  Patient presents with   Cough    [name] is a 51 y.o. male who presents to the ED again today for evaluation of cough and flulike symptoms.  Patient was seen yesterday in this ED for similar symptoms but feels that his illness is not improving.  He also does not understand his medications since the prescription was written in [place].  He endorses cough with occasional phlegm and sore throat from coughing.  He denies fevers, chills, abdominal pain, nausea, vomiting/diarrhea.  Video interpreter was used for this encounter.   Cough Associated symptoms: no fever, no headaches, no rash and no shortness of breath       Home Medications Prior to Admission medications   Medication Sig Start Date End Date Taking? Authorizing Provider  guaiFENesin 200 MG tablet Take 1 tablet (200 mg total) by mouth every 4 (four) hours as needed for cough or to loosen phlegm. 06/25/21  Yes Raynald Blend R, PA-C  albuterol (PROVENTIL HFA;VENTOLIN HFA) 108 (90 Base) MCG/ACT inhaler Inhale 1-2 puffs into the lungs every 4 (four) hours as needed for wheezing or shortness of breath. Patient not taking: Reported on 01/14/2020 11/01/17   Mesner, Barbara Cower, MD  albuterol (PROVENTIL HFA;VENTOLIN HFA) 108 (90 Base) MCG/ACT inhaler Inhale 1-2 puffs into the lungs every 4 (four) hours as needed for wheezing or shortness of breath (or cough). Patient not taking: Reported on 01/14/2020 11/22/17   Street, Counce, PA-C  albuterol (PROVENTIL) (2.5 MG/3ML) 0.083% nebulizer solution Take 3 mLs (2.5 mg total) by nebulization every 4 (four) hours as needed for wheezing or shortness of breath. Patient not taking: Reported on 01/14/2020 11/22/17   Street, Sylva, PA-C  benzonatate (TESSALON) 100 MG capsule Take 1 capsule (100 mg total) by mouth 3 (three) times daily as needed for  cough. 06/24/21   Long, Arlyss Repress, MD  fluticasone (FLONASE) 50 MCG/ACT nasal spray Place 2 sprays into both nostrils daily for 7 days. 06/24/21 07/24/21  Long, Arlyss Repress, MD      Allergies    Patient has no known allergies.    Review of Systems   Review of Systems  Constitutional:  Negative for fever.  HENT: Negative.    Eyes: Negative.   Respiratory:  Positive for cough. Negative for shortness of breath.   Cardiovascular: Negative.   Gastrointestinal:  Negative for abdominal pain and vomiting.  Endocrine: Negative.   Genitourinary: Negative.   Musculoskeletal: Negative.   Skin:  Negative for rash.  Neurological:  Negative for headaches.  All other systems reviewed and are negative.  Physical Exam Updated Vital Signs BP 106/60 (BP Location: Left Arm)    Pulse 71    Temp 98.6 F (37 C) (Oral)    Resp 18    SpO2 96%  Physical Exam Vitals and nursing note reviewed.  Constitutional:      General: He is not in acute distress.    Appearance: He is not ill-appearing.  HENT:     Head: Atraumatic.  Eyes:     Conjunctiva/sclera: Conjunctivae normal.  Cardiovascular:     Rate and Rhythm: Normal rate and regular rhythm.     Pulses: Normal pulses.     Heart sounds: No murmur heard. Pulmonary:     Effort: Pulmonary effort is normal. No respiratory distress.     Breath sounds: Normal breath sounds.  Comments: Lung clear to ausculation bilaterally. No tachypnea, no accessory muscle use, no acute distress, no increased work of breathing, no decrease in air movement  Abdominal:     General: Abdomen is flat. There is no distension.     Palpations: Abdomen is soft.     Tenderness: There is no abdominal tenderness.  Musculoskeletal:        General: Normal range of motion.     Cervical back: Normal range of motion.  Skin:    General: Skin is warm and dry.     Capillary Refill: Capillary refill takes less than 2 seconds.  Neurological:     General: No focal deficit present.     Mental  Status: He is alert.  Psychiatric:        Mood and Affect: Mood normal.    ED Results / Procedures / Treatments   Labs (all labs ordered are listed, but only abnormal results are displayed) Labs Reviewed - No data to display  EKG None  Radiology DG Chest 2 View  Result Date: 06/24/2021 CLINICAL DATA:  51 year old male with cough and sore throat.  Fever. EXAM: CHEST - 2 VIEW COMPARISON:  Portable chest 11/23/2019 and earlier. FINDINGS: Lung volumes and mediastinal contours are stable since 2019 and within normal limits. Visualized tracheal air column is within normal limits. Mild diffuse increased pulmonary interstitial markings are stable since 2019. No pneumothorax, pulmonary edema, pleural effusion or confluent pulmonary opacity. No acute osseous abnormality identified. Negative visible bowel gas. IMPRESSION: Stable since 2019.  No acute cardiopulmonary abnormality. Electronically Signed   By: Odessa Fleming M.D.   On: 06/24/2021 07:20    Procedures Procedures    Medications Ordered in ED Medications  ipratropium-albuterol (DUONEB) 0.5-2.5 (3) MG/3ML nebulizer solution 3 mL (3 mLs Nebulization Given 06/25/21 1450)    ED Course/ Medical Decision Making/ A&P                           Medical Decision Making  This patient presents to the ED for concern of cough, this involves an extensive number of treatment options, and is a complaint that carries with it a high risk of complications and morbidity.  The differential diagnosis includes The emergent ddx for new onset seizure includes but is not limited to idiopathic, trauma, Intracranial hemorrhage, mass, vascular lesion, Infection, metabolic disturbance, toxins/drugs, eclampsia, hypertensive encephalopathy, anoxic-ischemic injury.    Additional history obtained:  External records from outside source obtained and reviewed including discharge summary from ED visit yesterday   Lab Tests:  No labs indicated at this time   Imaging  Studies ordered:  Patient had chest x-ray done yesterday, not indicated today given normal O2 saturations and normal pulmonary exam      Medicines ordered and prescription drug management:  I ordered medication including DuoNeb for cough and difficulty breathing Reevaluation of the patient after these medicines showed that the patient improved I have reviewed the patients home medicines and have made adjustments as needed    Dispostion:  After consideration of the diagnostic results and the patients response to treatment feel that the patent would benefit from outpatient treatment. Viral URI: Discussed with patient proper use of his medications.  I also had his discharge papers translated into Spanish so that there is no confusion on how to properly use his medications as the prescription was also written in [place].  He improved with DuoNeb.  No wheezing.  I also sent him  in a prescription for guaifenesin at his request.  Advised him on natural course of illness and that a cough can take up to a couple weeks to fully resolve.  Discussed at home measures to improve his symptoms.  Patient is understanding and amenable to plan.  Discharged home in good condition.  Final Clinical Impression(s) / ED Diagnoses Final diagnoses:  Viral URI with cough    Rx / DC Orders ED Discharge Orders          Ordered    guaiFENesin 200 MG tablet  Every 4 hours PRN        06/25/21 1509              Janell QuietConklin, Mathilde Mcwherter R, PA-C 06/25/21 1722    Glendora ScoreKommor, [place], MD 06/26/21 [phone]

## 2021-06-25 NOTE — Discharge Instructions (Addendum)
Lo vieron hoy para una evaluacin de su tos. 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 La tos en particular puede durar hasta un par de semanas.  Durante Medical laboratory scientific officer, recomiendo usar un medicamento llamado gauifenesina. He enviado esto a la farmacia por ti. Puede tomar una tableta cada 4 horas segn sea necesario para la tos y la flema. Por la noche, puede usar el medicamento que recibi ayer: las cpsulas de Benzonatate para suprimir la tos y ayudarlo a dormir. Tome 1 cpsula cada 6 horas segn sea necesario para suprimir la tos.  Contine usando Biomedical engineer dieron segn sea necesario si tiene dificultad para Industrial/product designer. Sentirse mejor pronto!

## 2021-06-26 ENCOUNTER — Other Ambulatory Visit: Payer: Self-pay

## 2021-06-27 ENCOUNTER — Other Ambulatory Visit (HOSPITAL_COMMUNITY): Payer: Self-pay

## 2022-03-06 ENCOUNTER — Emergency Department (HOSPITAL_COMMUNITY)
Admission: EM | Admit: 2022-03-06 | Discharge: 2022-03-06 | Disposition: A | Discharge status: Home / Self Care | Payer: Self-pay | Attending: Emergency Medicine | Admitting: Emergency Medicine

## 2022-03-06 ENCOUNTER — Encounter (HOSPITAL_COMMUNITY): Payer: Self-pay

## 2022-03-06 ENCOUNTER — Other Ambulatory Visit (HOSPITAL_COMMUNITY): Payer: Self-pay

## 2022-03-06 ENCOUNTER — Other Ambulatory Visit: Payer: Self-pay

## 2022-03-06 ENCOUNTER — Emergency Department (HOSPITAL_COMMUNITY): Payer: Self-pay

## 2022-03-06 DIAGNOSIS — Z20822 Contact with and (suspected) exposure to covid-19: Secondary | ICD-10-CM | POA: Insufficient documentation

## 2022-03-06 DIAGNOSIS — R519 Headache, unspecified: Secondary | ICD-10-CM | POA: Insufficient documentation

## 2022-03-06 DIAGNOSIS — Z7951 Long term (current) use of inhaled steroids: Secondary | ICD-10-CM | POA: Insufficient documentation

## 2022-03-06 DIAGNOSIS — J45901 Unspecified asthma with (acute) exacerbation: Secondary | ICD-10-CM | POA: Insufficient documentation

## 2022-03-06 LAB — RESP PANEL BY RT-PCR (FLU A&B, COVID) ARPGX2
Influenza A by PCR: NEGATIVE
Influenza B by PCR: NEGATIVE
SARS Coronavirus 2 by RT PCR: NEGATIVE

## 2022-03-06 MED ORDER — METHYLPREDNISOLONE SODIUM SUCC 125 MG IJ SOLR
60.0000 mg | Freq: Once | INTRAMUSCULAR | Status: AC
  Administered 2022-03-06: 60 mg via INTRAMUSCULAR
  Filled 2022-03-06: qty 2

## 2022-03-06 MED ORDER — ALBUTEROL SULFATE HFA 108 (90 BASE) MCG/ACT IN AERS
1.0000 | INHALATION_SPRAY | RESPIRATORY_TRACT | 0 refills | Status: DC | PRN
  Filled 2022-03-06: qty 6.7, 16d supply, fill #0

## 2022-03-06 MED ORDER — ALBUTEROL SULFATE (2.5 MG/3ML) 0.083% IN NEBU
2.5000 mg | INHALATION_SOLUTION | RESPIRATORY_TRACT | 12 refills | Status: DC | PRN
  Filled 2022-03-06: qty 90, 5d supply, fill #0

## 2022-03-06 MED ORDER — METHYLPREDNISOLONE SODIUM SUCC 125 MG IJ SOLR: 125.0000 mg | Freq: Once | INTRAMUSCULAR | Status: DC

## 2022-03-06 MED ORDER — ALBUTEROL SULFATE (2.5 MG/3ML) 0.083% IN NEBU: 2.5000 mg | INHALATION_SOLUTION | RESPIRATORY_TRACT | 12 refills | Status: DC | PRN

## 2022-03-06 MED ORDER — IPRATROPIUM-ALBUTEROL 0.5-2.5 (3) MG/3ML IN SOLN
3.0000 mL | RESPIRATORY_TRACT | Status: AC
  Administered 2022-03-06 (×3): 3 mL via RESPIRATORY_TRACT
  Filled 2022-03-06 (×2): qty 9

## 2022-03-06 MED ORDER — PREDNISONE 10 MG PO TABS
40.0000 mg | ORAL_TABLET | Freq: Every day | ORAL | 0 refills | Status: DC
  Filled 2022-03-06: qty 20, 5d supply, fill #0

## 2022-03-06 MED ORDER — PREDNISONE 10 MG PO TABS
40.0000 mg | ORAL_TABLET | Freq: Every day | ORAL | 0 refills | Status: AC
  Filled 2022-03-06: qty 20, 5d supply, fill #0

## 2022-03-06 MED ORDER — ALBUTEROL SULFATE HFA 108 (90 BASE) MCG/ACT IN AERS
1.0000 | INHALATION_SPRAY | RESPIRATORY_TRACT | 0 refills | Status: DC | PRN
  Filled 2022-03-06: qty 6.7, 25d supply, fill #0

## 2022-03-06 MED ORDER — ACETAMINOPHEN 325 MG PO TABS
650.0000 mg | ORAL_TABLET | Freq: Once | ORAL | Status: AC
  Administered 2022-03-06: 650 mg via ORAL
  Filled 2022-03-06: qty 2

## 2022-03-06 NOTE — ED Triage Notes (Signed)
Patient c/o SOB and a headache x 4 days. Patient denies fever.

## 2022-03-06 NOTE — ED Provider Notes (Addendum)
[facility]-EMERGENCY DEPT Provider Note   CSN: [id number] Arrival date & time: 03/06/22  8768     History  Chief Complaint  Patient presents with   Shortness of Breath   Headache    Mark Love is a 51 y.o. male.   Shortness of Breath Associated symptoms: headaches and wheezing   Headache    51 year old male with medical history significant for asthma who presents to the emergency department roughly 4 days of mildly productive cough and shortness of breath.  The patient states that he has no known diagnosis of COPD.  He is not followed outpatient for his asthma.  He states that he normally presents to the emergency department for asthma exacerbations and is given an albuterol MDI and subsequently discharged.  He denies any fevers or chills.  He states that his home MDI has not been helping.  He denies any chest pain, endorses mild dyspnea on exertion with associated wheezing.  No known triggers that he can think of.  He denies a smoking history.  Home Medications Prior to Admission medications   Medication Sig Start Date End Date Taking? Authorizing Provider  predniSONE (DELTASONE) 10 MG tablet Take 4 tablets (40 mg total) by mouth daily for 5 days. 03/06/22 03/11/22 Yes Ernie Avena, MD  albuterol (PROVENTIL) (2.5 MG/3ML) 0.083% nebulizer solution Take 3 mLs (2.5 mg total) by nebulization every 4 (four) hours as needed for wheezing or shortness of breath. 03/06/22   Ernie Avena, MD  albuterol (VENTOLIN HFA) 108 (90 Base) MCG/ACT inhaler Inhale 1-2 puffs into the lungs every 4 (four) hours as needed for wheezing or shortness of breath. 03/06/22   Ernie Avena, MD  benzonatate (TESSALON) 100 MG capsule Take 1 capsule (100 mg total) by mouth 3 (three) times daily as needed for cough. 06/24/21   Long, Arlyss Repress, MD  fluticasone (FLONASE) 50 MCG/ACT nasal spray Place 2 sprays into both nostrils daily for 7 days. 06/24/21 07/24/21  Long, Arlyss Repress, MD   guaiFENesin 200 MG tablet Take 1 tablet (200 mg total) by mouth every 4 (four) hours as needed for cough or to loosen phlegm. 06/25/21   Janell Quiet, PA-C      Allergies    Patient has no known allergies.    Review of Systems   Review of Systems  Respiratory:  Positive for shortness of breath and wheezing.   Neurological:  Positive for headaches.  All other systems reviewed and are negative.   Physical Exam Updated Vital Signs BP 123/85 (BP Location: Right Arm)   Pulse 83   Temp 98.3 F (36.8 C)   Resp 18   Ht 5' (1.524 m)   Wt 72.6 kg   SpO2 94%   BMI 31.25 kg/m  Physical Exam Vitals and nursing note reviewed.  Constitutional:      General: He is not in acute distress.    Appearance: He is well-developed.  HENT:     Head: Normocephalic and atraumatic.  Eyes:     Conjunctiva/sclera: Conjunctivae normal.  Cardiovascular:     Rate and Rhythm: Normal rate and regular rhythm.     Heart sounds: No murmur heard. Pulmonary:     Effort: Pulmonary effort is normal. No respiratory distress.     Breath sounds: Wheezing present.     Comments: Mild bilateral expiratory wheezing present on exam, no rales or rhonchi, no tachypnea, resting comfortably, effort is normal Abdominal:     Palpations: Abdomen is soft.  Tenderness: There is no abdominal tenderness.  Musculoskeletal:        General: No swelling.     Cervical back: Neck supple.  Skin:    General: Skin is warm and dry.     Capillary Refill: Capillary refill takes less than 2 seconds.  Neurological:     Mental Status: He is alert.  Psychiatric:        Mood and Affect: Mood normal.     ED Results / Procedures / Treatments   Labs (all labs ordered are listed, but only abnormal results are displayed) Labs Reviewed  RESP PANEL BY RT-PCR (FLU A&B, COVID) ARPGX2    EKG EKG Interpretation  Date/Time:  Tuesday March 06 2022 08:59:08 EDT Ventricular Rate:  82 PR Interval:  149 QRS Duration: 88 QT  Interval:  377 QTC Calculation: 441 R Axis:   44 Text Interpretation: Sinus rhythm Abnormal R-wave progression, early transition Confirmed by Ernie Avena ([id number]) on 03/06/2022 10:18:26 AM  Radiology DG Chest 2 View  Result Date: 03/06/2022 CLINICAL DATA:  Shortness of breath EXAM: CHEST - 2 VIEW COMPARISON:  06/24/2021 FINDINGS: The heart size and mediastinal contours are within normal limits. Both lungs are clear. The visualized skeletal structures are unremarkable. IMPRESSION: Negative. Electronically Signed   By: Charlett Nose M.D.   On: 03/06/2022 09:59    Procedures Procedures    Medications Ordered in ED Medications  ipratropium-albuterol (DUONEB) 0.5-2.5 (3) MG/3ML nebulizer solution 3 mL (3 mLs Nebulization Given 03/06/22 1107)  acetaminophen (TYLENOL) tablet 650 mg (650 mg Oral Given 03/06/22 1056)  methylPREDNISolone sodium succinate (SOLU-MEDROL) 125 mg/2 mL injection 60 mg (60 mg Intramuscular Given 03/06/22 1051)    ED Course/ Medical Decision Making/ A&P                           Medical Decision Making Amount and/or Complexity of Data Reviewed Radiology: ordered.  Risk OTC drugs. Prescription drug management.    51 year old male with medical history significant for asthma who presents to the emergency department roughly 4 days of mildly productive cough and shortness of breath.  The patient states that he has no known diagnosis of COPD.  He is not followed outpatient for his asthma.  He states that he normally presents to the emergency department for asthma exacerbations and is given an albuterol MDI and subsequently discharged.  He denies any fevers or chills.  He states that his home MDI has not been helping.  He denies any chest pain, endorses mild dyspnea on exertion with associated wheezing.  No known triggers that he can think of.  He denies a smoking history.  On arrival, patient was vitally stable.  Mild bilateral expiratory wheezing present on physical exam.   Differential diagnosis includes viral URI, COVID-19, asthma exacerbation.  Patient has no diagnosis of COPD.  Initially considered bacterial pneumonia given the patient's report of productive cough.  EKG was performed which revealed no acute ischemic changes, chest x-ray revealed clear lungs with no focal airspace disease.  COVID-19 influenza PCR testing was collected and resulted negative.  The patient was administered DuoNebs and IM Solu-Medrol in the emergency department with subsequent improvement.  Overall feels that symptoms are consistent with likely mild asthma exacerbation.  Not tachypneic on repeat assessment clear lungs noted on auscultation.  Overall feel the patient is stable for continued outpatient management.  Advised patient follow-up with either primary care physician or pulmonologist for continued long-term management of his  asthma.  Following discharge, I was informed by the patient's pharmacy that he would need medication management assistance.  I did place a TOC consult and reached out to on-call social work to contact the patient to arrange for outpatient assistance for both PCP follow-up and medication management. Spoke with Cheree Ditto and Janae Sauce, who will reach out to the patient.   Final Clinical Impression(s) / ED Diagnoses Final diagnoses:  Asthma with acute exacerbation, unspecified asthma severity, unspecified whether persistent    Rx / DC Orders ED Discharge Orders          Ordered    Ambulatory referral to Pulmonology        03/06/22 1129    predniSONE (DELTASONE) 10 MG tablet  Daily        03/06/22 1220    albuterol (VENTOLIN HFA) 108 (90 Base) MCG/ACT inhaler  Every 4 hours PRN        03/06/22 1221    albuterol (PROVENTIL) (2.5 MG/3ML) 0.083% nebulizer solution  Every 4 hours PRN        03/06/22 1221              Regan Lemming, MD 03/06/22 1222    Regan Lemming, MD 03/06/22 1425

## 2022-03-19 ENCOUNTER — Emergency Department (HOSPITAL_COMMUNITY)
Admission: EM | Admit: 2022-03-19 | Discharge: 2022-03-20 | Disposition: A | Discharge status: Home / Self Care | Payer: Self-pay | Attending: Emergency Medicine | Admitting: Emergency Medicine

## 2022-03-19 ENCOUNTER — Encounter (HOSPITAL_COMMUNITY): Payer: Self-pay

## 2022-03-19 ENCOUNTER — Emergency Department (HOSPITAL_COMMUNITY): Payer: Self-pay

## 2022-03-19 DIAGNOSIS — D72829 Elevated white blood cell count, unspecified: Secondary | ICD-10-CM | POA: Insufficient documentation

## 2022-03-19 DIAGNOSIS — Z7951 Long term (current) use of inhaled steroids: Secondary | ICD-10-CM | POA: Insufficient documentation

## 2022-03-19 DIAGNOSIS — Z20822 Contact with and (suspected) exposure to covid-19: Secondary | ICD-10-CM | POA: Insufficient documentation

## 2022-03-19 DIAGNOSIS — Z87891 Personal history of nicotine dependence: Secondary | ICD-10-CM | POA: Insufficient documentation

## 2022-03-19 DIAGNOSIS — J45909 Unspecified asthma, uncomplicated: Secondary | ICD-10-CM | POA: Insufficient documentation

## 2022-03-19 DIAGNOSIS — J069 Acute upper respiratory infection, unspecified: Secondary | ICD-10-CM | POA: Insufficient documentation

## 2022-03-19 LAB — CBC WITH DIFFERENTIAL/PLATELET
Abs Immature Granulocytes: 0.05 10*3/uL (ref 0.00–0.07)
Basophils Absolute: 0.1 10*3/uL (ref 0.0–0.1)
Basophils Relative: 1 %
Eosinophils Absolute: 1.7 10*3/uL — ABNORMAL HIGH (ref 0.0–0.5)
Eosinophils Relative: 11 %
HCT: 44.8 % (ref 39.0–52.0)
Hemoglobin: 14.7 g/dL (ref 13.0–17.0)
Immature Granulocytes: 0 %
Lymphocytes Relative: 23 %
Lymphs Abs: 3.4 10*3/uL (ref 0.7–4.0)
MCH: 30.2 pg (ref 26.0–34.0)
MCHC: 32.8 g/dL (ref 30.0–36.0)
MCV: 92 fL (ref 80.0–100.0)
Monocytes Absolute: 0.9 10*3/uL (ref 0.1–1.0)
Monocytes Relative: 6 %
Neutro Abs: 8.7 10*3/uL — ABNORMAL HIGH (ref 1.7–7.7)
Neutrophils Relative %: 59 %
Platelets: 370 10*3/uL (ref 150–400)
RBC: 4.87 MIL/uL (ref 4.22–5.81)
RDW: 14 % (ref 11.5–15.5)
WBC: 14.8 10*3/uL — ABNORMAL HIGH (ref 4.0–10.5)
nRBC: 0 % (ref 0.0–0.2)

## 2022-03-19 LAB — BASIC METABOLIC PANEL
Anion gap: 7 (ref 5–15)
BUN: 11 mg/dL (ref 6–20)
CO2: 25 mmol/L (ref 22–32)
Calcium: 9.4 mg/dL (ref 8.9–10.3)
Chloride: 101 mmol/L (ref 98–111)
Creatinine, Ser: 0.88 mg/dL (ref 0.61–1.24)
GFR, Estimated: >60 mL/min (ref >60)
Glucose, Bld: 390 mg/dL — ABNORMAL HIGH (ref 70–99)
Potassium: 4.4 mmol/L (ref 3.5–5.1)
Sodium: 133 mmol/L — ABNORMAL LOW (ref 135–145)

## 2022-03-19 LAB — RESP PANEL BY RT-PCR (FLU A&B, COVID) ARPGX2
Influenza A by PCR: NEGATIVE
Influenza B by PCR: NEGATIVE
SARS Coronavirus 2 by RT PCR: NEGATIVE

## 2022-03-19 LAB — GROUP A STREP BY PCR: Group A Strep by PCR: NOT DETECTED

## 2022-03-19 NOTE — ED Notes (Signed)
I made nurse aware of o2 stats

## 2022-03-19 NOTE — ED Notes (Signed)
Urine in lab 

## 2022-03-19 NOTE — ED Triage Notes (Signed)
Pt arrived via POV, c/o headache, sore throat, coughing up blood.

## 2022-03-19 NOTE — ED Provider Triage Note (Signed)
Emergency Medicine Provider Triage Evaluation Note  Mark Love , a 51 y.o. male  was evaluated in triage.  Pt complains of feeling ill x3 weeks.  Endorses cough with streaky hemoptysis, also endorses headache, sore throat.  No chest pain..  Review of Systems  Per HPI  Physical Exam  BP 124/83 (BP Location: Right Arm)   Pulse 86   Temp 98.3 F (36.8 C) (Oral)   Resp 18   SpO2 91%  Gen:   Awake, no distress   Resp:  Normal effort  MSK:   Moves extremities without difficulty  Other:  Wheezing to lower lobes  Medical Decision Making  Medically screening exam initiated at 2:10 PM.  Appropriate orders placed.  Mark Love was informed that the remainder of the evaluation will be completed by another provider, this initial triage assessment does not replace that evaluation, and the importance of remaining in the ED until their evaluation is complete.     Sherrill Raring, PA-C 03/19/22 1410

## 2022-03-20 ENCOUNTER — Other Ambulatory Visit: Payer: Self-pay

## 2022-03-20 ENCOUNTER — Encounter (HOSPITAL_COMMUNITY): Payer: Self-pay | Admitting: Emergency Medicine

## 2022-03-20 MED ORDER — GUAIFENESIN 100 MG/5ML PO LIQD
5.0000 mL | ORAL | 0 refills | Status: AC | PRN
  Filled 2022-03-20: qty 120, 4d supply, fill #0

## 2022-03-20 MED ORDER — FLUTICASONE PROPIONATE 50 MCG/ACT NA SUSP
1.0000 | Freq: Every day | NASAL | 0 refills | Status: AC
  Filled 2022-03-20: qty 16, 7d supply, fill #0

## 2022-03-20 MED ORDER — PSEUDOEPHEDRINE HCL 60 MG PO TABS
60.0000 mg | ORAL_TABLET | Freq: Four times a day (QID) | ORAL | 0 refills | Status: AC | PRN
  Filled 2022-03-20: qty 30, 8d supply, fill #0

## 2022-03-20 MED ORDER — IBUPROFEN 600 MG PO TABS
600.0000 mg | ORAL_TABLET | Freq: Four times a day (QID) | ORAL | 0 refills | Status: AC | PRN
  Filled 2022-03-20: qty 30, 8d supply, fill #0

## 2022-03-20 MED ORDER — ACETAMINOPHEN 325 MG PO TABS
650.0000 mg | ORAL_TABLET | Freq: Four times a day (QID) | ORAL | 0 refills | Status: AC | PRN
  Filled 2022-03-20: qty 36, 5d supply, fill #0

## 2022-03-20 MED ORDER — DEXAMETHASONE 4 MG PO TABS
8.0000 mg | ORAL_TABLET | Freq: Once | ORAL | Status: AC
  Administered 2022-03-20: 8 mg via ORAL
  Filled 2022-03-20: qty 2

## 2022-03-20 MED ORDER — GUAIFENESIN-CODEINE 100-10 MG/5ML PO SOLN
10.0000 mL | Freq: Every evening | ORAL | 0 refills | Status: DC | PRN
  Filled 2022-03-20: qty 118, 11d supply, fill #0

## 2022-03-20 MED ORDER — GUAIFENESIN-DM 100-10 MG/5ML PO SYRP
5.0000 mL | ORAL_SOLUTION | Freq: Once | ORAL | Status: AC
  Administered 2022-03-20: 5 mL via ORAL
  Filled 2022-03-20: qty 10

## 2022-03-20 NOTE — ED Provider Notes (Addendum)
[facility]-EMERGENCY DEPT Provider Note   CSN: [id number] Arrival date & time: 03/19/22  1312     History  Chief Complaint  Patient presents with   Cough   Sore Throat    Mark Love is a 51 y.o. male.  Patient as above with significant medical history as below, including asthma, Spanish-speaking who presents to the ED with complaint of cough, congestion, rhinorrhea, sore throat.  Is ongoing for approximately 3 weeks.  Reports he typically has similar presentation once a year.  He was seen in the ED 9/19 with similar complaints, he has not followed-up with pulmonology.  Denies fevers or chills, no nausea or vomiting.  No significant dyspnea.  He is on postnasal drip, intermittently productive cough with white sputum, sometimes with streaks of blood after prolonged coughing spell.  Reports the cough is keeping him up at night.  He is having some body aches. No Chest pain.  No change in bowel or bladder function.  No recent travel or sick contacts.  No fevers.  No dysphagia     Past Medical History:  Diagnosis Date   Asthma    Language barrier to communication     History reviewed. No pertinent surgical history.   Spanish interpreter utilized for entirety of evaluation  The history is provided by the patient. The history is limited by a language barrier. A language interpreter was used.  Cough Associated symptoms: rhinorrhea and sore throat   Associated symptoms: no chest pain, no chills, no fever, no headaches, no rash and no shortness of breath   Sore Throat Pertinent negatives include no chest pain, no abdominal pain, no headaches and no shortness of breath.       Home Medications Prior to Admission medications   Medication Sig Start Date End Date Taking? Authorizing Provider  acetaminophen (TYLENOL) 325 MG tablet Take 2 tablets (650 mg total) by mouth every 6 (six) hours as needed. 03/20/22  Yes Tanda Rockers A, DO  fluticasone (FLONASE) 50  MCG/ACT nasal spray Place 1 spray into both nostrils daily for 7 days. 03/20/22 03/27/22 Yes Sloan Leiter, DO  guaiFENesin (ROBITUSSIN) 100 MG/5ML liquid Take 5 mLs by mouth every 4 (four) hours as needed for cough or to loosen phlegm. For daytime 03/20/22  Yes Tanda Rockers A, DO  guaiFENesin-codeine 100-10 MG/5ML syrup Take 10 mLs by mouth at bedtime as needed for cough. 03/20/22  Yes Tanda Rockers A, DO  ibuprofen (ADVIL) 600 MG tablet Take 1 tablet (600 mg total) by mouth every 6 (six) hours as needed. 03/20/22  Yes Tanda Rockers A, DO  pseudoephedrine (SUDAFED) 60 MG tablet Take 1 tablet (60 mg total) by mouth every 6 (six) hours as needed for congestion. 03/20/22  Yes Tanda Rockers A, DO  albuterol (PROVENTIL) (2.5 MG/3ML) 0.083% nebulizer solution Inhale 3 mLs (2.5 mg total) by nebulization every 4 (four) hours as needed for wheezing or shortness of breath. 03/06/22   Ernie Avena, MD  albuterol (VENTOLIN HFA) 108 (90 Base) MCG/ACT inhaler Inhale 1-2 puffs into the lungs every 4 (four) hours as needed for wheezing or shortness of breath. 03/06/22   Ernie Avena, MD  benzonatate (TESSALON) 100 MG capsule Take 1 capsule (100 mg total) by mouth 3 (three) times daily as needed for cough. 06/24/21   Long, Arlyss Repress, MD  fluticasone (FLONASE) 50 MCG/ACT nasal spray Place 2 sprays into both nostrils daily for 7 days. 06/24/21 07/24/21  Long, Arlyss Repress, MD  Allergies    Patient has no known allergies.    Review of Systems   Review of Systems  Constitutional:  Negative for chills and fever.  HENT:  Positive for congestion, postnasal drip, rhinorrhea and sore throat. Negative for facial swelling and trouble swallowing.   Eyes:  Negative for photophobia and visual disturbance.  Respiratory:  Positive for cough. Negative for shortness of breath.   Cardiovascular:  Negative for chest pain and palpitations.  Gastrointestinal:  Negative for abdominal pain, nausea and vomiting.  Endocrine: Negative for  polydipsia and polyuria.  Genitourinary:  Negative for difficulty urinating and hematuria.  Musculoskeletal:  Negative for gait problem and joint swelling.  Skin:  Negative for pallor and rash.  Neurological:  Negative for syncope and headaches.  Psychiatric/Behavioral:  Negative for agitation and confusion.     Physical Exam Updated Vital Signs BP 102/63   Pulse 70   Temp 98.2 F (36.8 C)   Resp 18   SpO2 93%  Physical Exam Vitals and nursing note reviewed.  Constitutional:      General: He is not in acute distress.    Appearance: Normal appearance. He is well-developed. He is not ill-appearing, toxic-appearing or diaphoretic.  HENT:     Head: Normocephalic and atraumatic.     Jaw: There is normal jaw occlusion. No trismus.     Comments: No drooling trismus or stridor    Right Ear: External ear normal.     Left Ear: External ear normal.     Mouth/Throat:     Mouth: Mucous membranes are moist.     Pharynx: Oropharynx is clear. Uvula midline. No uvula swelling.     Tonsils: No tonsillar abscesses.     Comments: No angioedema Eyes:     General: No scleral icterus. Cardiovascular:     Rate and Rhythm: Normal rate and regular rhythm.     Pulses: Normal pulses.     Heart sounds: Normal heart sounds.  Pulmonary:     Effort: Pulmonary effort is normal. No tachypnea, accessory muscle usage, respiratory distress or retractions.     Breath sounds: No stridor. Wheezing present.     Comments: Trace expiratory wheeze bilateral  Abdominal:     General: Abdomen is flat.     Palpations: Abdomen is soft.     Tenderness: There is no abdominal tenderness.  Musculoskeletal:        General: Normal range of motion.     Cervical back: Normal range of motion.     Right lower leg: No edema.     Left lower leg: No edema.  Skin:    General: Skin is warm and dry.     Capillary Refill: Capillary refill takes less than 2 seconds.  Neurological:     Mental Status: He is alert and oriented to  person, place, and time.  Psychiatric:        Mood and Affect: Mood normal.        Behavior: Behavior normal.     ED Results / Procedures / Treatments   Labs (all labs ordered are listed, but only abnormal results are displayed) Labs Reviewed  BASIC METABOLIC PANEL - Abnormal; Notable for the following components:      Result Value   Sodium 133 (*)    Glucose, Bld 390 (*)    All other components within normal limits  CBC WITH DIFFERENTIAL/PLATELET - Abnormal; Notable for the following components:   WBC 14.8 (*)    Neutro Abs 8.7 (*)  Eosinophils Absolute 1.7 (*)    All other components within normal limits  RESP PANEL BY RT-PCR (FLU A&B, COVID) ARPGX2  GROUP A STREP BY PCR    EKG None  Radiology DG Chest 2 View  Result Date: 03/19/2022 CLINICAL DATA:  Shortness of breath. Patient reports coughing up blood. EXAM: CHEST - 2 VIEW COMPARISON:  Chest radiograph 03/06/2022 and earlier FINDINGS: Heart is normal in size. Mild tortuosity of the thoracic aorta. Both lungs are clear. No pleural effusion or pneumothorax. Multilevel degenerative changes in the thoracic spine with mild height loss of a midthoracic vertebral body, unchanged compared to prior exams. IMPRESSION: No acute cardiopulmonary abnormality. Electronically Signed   By: Sherron Ales M.D.   On: 03/19/2022 14:38    Procedures Procedures    Medications Ordered in ED Medications  guaiFENesin-dextromethorphan (ROBITUSSIN DM) 100-10 MG/5ML syrup 5 mL (5 mLs Oral Given 03/20/22 0235)  dexamethasone (DECADRON) tablet 8 mg (8 mg Oral Given 03/20/22 0236)    ED Course/ Medical Decision Making/ A&P Clinical Course as of 03/20/22 0256  Tue Mar 20, 2022  0249 MCHC: 32.8 [SG]    Clinical Course User Index [SG] Sloan Leiter, DO                           Medical Decision Making Amount and/or Complexity of Data Reviewed Labs:  Decision-making details documented in ED Course.  Risk OTC drugs. Prescription drug  management.   This patient presents to the ED with chief complaint(s) of cough, congestion, rhinorrhea, URI symptoms with pertinent past medical history of as above which further complicates the presenting complaint. The complaint involves an extensive differential diagnosis and also carries with it a high risk of complications and morbidity.    The differential diagnosis includes but not limited to viral syndrome, bacterial syndrome, COPD exacerbation, asthma exacerbation, pneumonia, bronchitis, bronchospasm. Serious etiologies were considered.   The initial plan is to screen labs, x-ray ordered in triage   Additional history obtained: Additional history obtained from  not applicable Records reviewed  prior ED visits, medications, prior labs and imaging  Independent labs interpretation:  The following labs were independently interpreted:  RVP negative, strep negative, BMP is stable, mild elevate glucose, advised to follow-up with his PCP regarding elevated Leukos.  No anion gap.  No DKA.  Mild leukocytosis 14.8, he is not septic, no hypoxia.  Independent visualization of imaging: - I independently visualized the following imaging with scope of interpretation limited to determining acute life threatening conditions related to emergency care: X-ray chest, which revealed no acute process  Cardiac monitoring was reviewed and interpreted by myself which shows NSR  Treatment and Reassessment: Antitussive, Decadron .>>  Improved  Consultation: - Consulted or discussed management/test interpretation w/ external professional: na  Consideration for admission or further workup: Admission was considered   Well-appearing 51 year old Spanish-speaking male to the ED with cough, congestion, URI symptoms.  No dyspnea, no chest pain.  Review of records> does appear to be a recurrent problem.  He has not yet followed with pulmonology.  Labs reviewed and are stable.  Imaging stable.  Favor likely viral  syndrome as etiology is complaint today versus possible asthma, bronchospasm.  Likely pneumonia given chest x-ray without consolidation.  afebrile.   Discussed supportive care at home  He has albuterol MDI at home.  Not need refill  Mucus is clear, low suspicion for bacterial sinusitis or other bacterial infection.  He is ambulatory  with steady gait, he is afebrile, not hypoxic, no respiratory distress. Advised pt f/u with pulm and with pcp, RTED if worse  The patient improved significantly and was discharged in stable condition. Detailed discussions were had with the patient regarding current findings, and need for close f/u with PCP or on call doctor. The patient has been instructed to return immediately if the symptoms worsen in any way for re-evaluation. Patient verbalized understanding and is in agreement with current care plan. All questions answered prior to discharge.   Social Determinants of health: Social History   Tobacco Use   Smoking status: Never   Smokeless tobacco: Former  Building services engineer Use: Never used  Substance Use Topics   Alcohol use: Yes   Drug use: No            Final Clinical Impression(s) / ED Diagnoses Final diagnoses:  URI with cough and congestion    Rx / DC Orders ED Discharge Orders          Ordered    guaiFENesin-codeine 100-10 MG/5ML syrup  At bedtime PRN        03/20/22 0243    pseudoephedrine (SUDAFED) 60 MG tablet  Every 6 hours PRN        03/20/22 0243    acetaminophen (TYLENOL) 325 MG tablet  Every 6 hours PRN        03/20/22 0243    ibuprofen (ADVIL) 600 MG tablet  Every 6 hours PRN        03/20/22 0243    fluticasone (FLONASE) 50 MCG/ACT nasal spray  Daily        03/20/22 0243    guaiFENesin (ROBITUSSIN) 100 MG/5ML liquid  Every 4 hours PRN        03/20/22 0254              Sloan Leiter, DO 03/20/22 0253    Sloan Leiter, DO 03/20/22 1740

## 2022-03-20 NOTE — ED Notes (Signed)
Pt dressed for discharge. Pt calm and cooperative with nursing care. Pt verbalized understanding of medications. Pt ambulated from ed with steady gait.

## 2022-03-20 NOTE — Discharge Instructions (Addendum)
It was a pleasure caring for you today in the emergency department.  Please return to the emergency department for any worsening or worrisome symptoms.  Please take your prescribed medications as prescribed.  Please follow-up with pulmonology and with primary care doctor.   Fue un placer atenderle hoy en el departamento de emergencias.  Regrese al departamento de emergencias si cualquier sntoma que empeore o sea preocupante.  Tome los medicamentos recetados segn lo prescrito. Por favor seguimiento con neumologa y con mdico de atencin primaria.

## 2022-04-02 ENCOUNTER — Encounter (HOSPITAL_COMMUNITY): Payer: Self-pay

## 2022-04-02 ENCOUNTER — Other Ambulatory Visit: Payer: Self-pay

## 2022-04-02 ENCOUNTER — Emergency Department (HOSPITAL_COMMUNITY)
Admission: EM | Admit: 2022-04-02 | Discharge: 2022-04-02 | Disposition: A | Discharge status: Home / Self Care | Payer: Self-pay | Attending: Emergency Medicine | Admitting: Emergency Medicine

## 2022-04-02 DIAGNOSIS — Z7951 Long term (current) use of inhaled steroids: Secondary | ICD-10-CM | POA: Insufficient documentation

## 2022-04-02 DIAGNOSIS — R053 Chronic cough: Secondary | ICD-10-CM

## 2022-04-02 DIAGNOSIS — R059 Cough, unspecified: Secondary | ICD-10-CM | POA: Insufficient documentation

## 2022-04-02 MED ORDER — METHYLPREDNISOLONE 4 MG PO TBPK
ORAL_TABLET | ORAL | 0 refills | Status: AC
  Filled 2022-04-02: qty 21, 6d supply, fill #0

## 2022-04-02 MED ORDER — ALBUTEROL SULFATE HFA 108 (90 BASE) MCG/ACT IN AERS
2.0000 | INHALATION_SPRAY | Freq: Once | RESPIRATORY_TRACT | Status: AC
  Administered 2022-04-02: 2 via RESPIRATORY_TRACT
  Filled 2022-04-02: qty 6.7

## 2022-04-02 MED ORDER — GUAIFENESIN-CODEINE 100-10 MG/5ML PO SOLN
10.0000 mL | Freq: Every evening | ORAL | 0 refills | Status: AC | PRN
  Filled 2022-04-02: qty 118, 11d supply, fill #0

## 2022-04-02 MED ORDER — ALBUTEROL SULFATE (2.5 MG/3ML) 0.083% IN NEBU
2.5000 mg | INHALATION_SOLUTION | RESPIRATORY_TRACT | 12 refills | Status: DC | PRN
  Filled 2022-04-02: qty 75, 5d supply, fill #0

## 2022-04-02 NOTE — ED Triage Notes (Signed)
Pt states that he has been coughing for 5 weeks and states that he has asthma. Pt was given medication but states that it is finished and he would like some more.

## 2022-04-02 NOTE — ED Provider Notes (Signed)
[facility]-EMERGENCY DEPT Provider Note   CSN: [id number] Arrival date & time: 04/02/22  0426     History  Chief Complaint  Patient presents with   Cough    Mark Love is a 51 y.o. male.  The history is provided by the patient and medical records. The history is limited by a language barrier. A language interpreter was used.  Cough Cough characteristics:  Productive Sputum characteristics:  Clear Severity:  Moderate Onset quality:  Gradual Duration: 1 month. Timing:  Intermittent Progression:  Unchanged Chronicity:  Recurrent Smoker: no   Context: weather changes   Relieved by:  Beta-agonist inhaler and cough suppressants Worsened by:  Activity, exposure to cold air and environmental changes Associated symptoms: headaches (secondary to HA) and wheezing   Associated symptoms: no chest pain, no chills, no fever, no rash, no rhinorrhea, no shortness of breath, no sinus congestion and no sore throat        Home Medications Prior to Admission medications   Medication Sig Start Date End Date Taking? Authorizing Provider  acetaminophen (TYLENOL) 325 MG tablet Take 2 tablets (650 mg total) by mouth every 6 (six) hours as needed. 03/20/22   Tanda Rockers A, DO  albuterol (PROVENTIL) (2.5 MG/3ML) 0.083% nebulizer solution Inhale 3 mLs (2.5 mg total) by nebulization every 4 (four) hours as needed for wheezing or shortness of breath. 03/06/22   Ernie Avena, MD  albuterol (VENTOLIN HFA) 108 (90 Base) MCG/ACT inhaler Inhale 1-2 puffs into the lungs every 4 (four) hours as needed for wheezing or shortness of breath. 03/06/22   Ernie Avena, MD  benzonatate (TESSALON) 100 MG capsule Take 1 capsule (100 mg total) by mouth 3 (three) times daily as needed for cough. 06/24/21   Long, Arlyss Repress, MD  fluticasone (FLONASE) 50 MCG/ACT nasal spray Place 2 sprays into both nostrils daily for 7 days. 06/24/21 07/24/21  Long, Arlyss Repress, MD  fluticasone (FLONASE) 50 MCG/ACT  nasal spray Place 1 spray into both nostrils daily for 7 days. 03/20/22 03/27/22  Sloan Leiter, DO  guaiFENesin (ROBITUSSIN) 100 MG/5ML liquid Take 5 mLs by mouth every 4 (four) hours as needed for cough or to loosen phlegm. For daytime 03/20/22   Tanda Rockers A, DO  guaiFENesin-codeine 100-10 MG/5ML syrup Take 10 mLs by mouth at bedtime as needed for cough. 03/20/22   Sloan Leiter, DO  ibuprofen (ADVIL) 600 MG tablet Take 1 tablet (600 mg total) by mouth every 6 (six) hours as needed. 03/20/22   Sloan Leiter, DO  pseudoephedrine (SUDAFED) 60 MG tablet Take 1 tablet (60 mg total) by mouth every 6 (six) hours as needed for congestion. 03/20/22   Sloan Leiter, DO      Allergies    Patient has no known allergies.    Review of Systems   Review of Systems  Constitutional:  Negative for chills, fatigue, fever and unexpected weight change.  HENT:  Negative for rhinorrhea and sore throat.   Respiratory:  Positive for cough and wheezing. Negative for shortness of breath.   Cardiovascular:  Negative for chest pain and leg swelling.  Gastrointestinal:  Negative for nausea and vomiting.       Negative for  Heartburn   Skin:  Negative for rash.  Neurological:  Positive for headaches (secondary to HA).    Physical Exam Updated Vital Signs BP 113/88   Pulse 75   Temp 98.4 F (36.9 C) (Oral)   Resp 16   Ht 5' (  1.524 m)   Wt 72.6 kg   SpO2 95%   BMI 31.26 kg/m  Physical Exam Vitals and nursing note reviewed.  Constitutional:      General: He is not in acute distress.    Appearance: He is well-developed. He is not diaphoretic.  HENT:     Head: Normocephalic and atraumatic.  Eyes:     General: No scleral icterus.    Conjunctiva/sclera: Conjunctivae normal.  Cardiovascular:     Rate and Rhythm: Normal rate and regular rhythm.     Heart sounds: Normal heart sounds.  Pulmonary:     Effort: Pulmonary effort is normal. No respiratory distress.     Breath sounds: Normal breath sounds. No  wheezing or rales.  Abdominal:     Palpations: Abdomen is soft.     Tenderness: There is no abdominal tenderness.  Musculoskeletal:     Cervical back: Normal range of motion and neck supple.     Right lower leg: No edema.     Left lower leg: No edema.  Skin:    General: Skin is warm and dry.  Neurological:     Mental Status: He is alert.  Psychiatric:        Behavior: Behavior normal.     ED Results / Procedures / Treatments   Labs (all labs ordered are listed, but only abnormal results are displayed) Labs Reviewed - No data to display  EKG None  Radiology No results found.  Procedures Procedures    Medications Ordered in ED Medications  albuterol (VENTOLIN HFA) 108 (90 Base) MCG/ACT inhaler 2 puff (has no administration in time range)    ED Course/ Medical Decision Making/ A&P                           Medical Decision Making Chronic cough Differential diagnosis for emergent cause of cough includes but is not limited to upper respiratory infection, lower respiratory infection, allergies, asthma, irritants, foreign body, medications such as ACE inhibitors, reflux, asthma, CHF, lung cancer, interstitial lung disease, psychiatric causes, postnasal drip and postinfectious bronchospasm.  Recent cxr negative.  Doubt malignancy, TB, PE  Social determinants of health include - language barrier, uninsured  Will refill meds- encouraged OP pcp f/u   Risk OTC drugs. Prescription drug management.           Final Clinical Impression(s) / ED Diagnoses Final diagnoses:  None    Rx / DC Orders ED Discharge Orders     None         Margarita Mail, PA-C 04/02/22 [place], MD 04/02/22 2149

## 2022-04-02 NOTE — Discharge Instructions (Addendum)
Get help right away if: You have trouble breathing. You wheeze and cough and this does not get better after you take medicine. You have chest pain. You have trouble speaking more than one word in a sentence.

## 2022-06-05 DIAGNOSIS — Z139 Encounter for screening, unspecified: Secondary | ICD-10-CM

## 2022-06-05 LAB — GLUCOSE, POCT (MANUAL RESULT ENTRY): POC Glucose: 224 mg/dl — AB (ref 70–99)

## 2022-06-05 NOTE — Progress Notes (Unsigned)
New Patient Office Visit  Subjective    Patient ID: Mark Love, male    DOB: 04/22/1971  Age: 51 y.o. MRN: [id number]  CC: No chief complaint on file.   HPI Dontez Limas-Cantel presents to establish care Last seen [facility] in 2021 asthma/ HLD  HIV HCV colo    Outpatient Encounter Medications as of 06/06/2022  Medication Sig   acetaminophen (TYLENOL) 325 MG tablet Take 2 tablets (650 mg total) by mouth every 6 (six) hours as needed.   albuterol (PROVENTIL) (2.5 MG/3ML) 0.083% nebulizer solution Inhale 3 mLs (2.5 mg total) by nebulization every 4 (four) hours as needed for wheezing or shortness of breath.   albuterol (VENTOLIN HFA) 108 (90 Base) MCG/ACT inhaler Inhale 1-2 puffs into the lungs every 4 (four) hours as needed for wheezing or shortness of breath.   benzonatate (TESSALON) 100 MG capsule Take 1 capsule (100 mg total) by mouth 3 (three) times daily as needed for cough.   fluticasone (FLONASE) 50 MCG/ACT nasal spray Place 2 sprays into both nostrils daily for 7 days.   fluticasone (FLONASE) 50 MCG/ACT nasal spray Place 1 spray into both nostrils daily for 7 days.   guaiFENesin (ROBITUSSIN) 100 MG/5ML liquid Take 5 mLs by mouth every 4 (four) hours as needed for cough or to loosen phlegm. For daytime   guaiFENesin-codeine 100-10 MG/5ML syrup Take 10 mLs by mouth at bedtime as needed for cough.   ibuprofen (ADVIL) 600 MG tablet Take 1 tablet (600 mg total) by mouth every 6 (six) hours as needed.   methylPREDNISolone (MEDROL DOSEPAK) 4 MG TBPK tablet Use as directed   pseudoephedrine (SUDAFED) 60 MG tablet Take 1 tablet (60 mg total) by mouth every 6 (six) hours as needed for congestion.   No facility-administered encounter medications on file as of 06/06/2022.    Past Medical History:  Diagnosis Date   Asthma    Language barrier to communication     No past surgical history on file.  No family history on file.  Social History   Socioeconomic History    Marital status: Single    Spouse name: Not on file   Number of children: Not on file   Years of education: Not on file   Highest education level: Not on file  Occupational History   Not on file  Tobacco Use   Smoking status: Never   Smokeless tobacco: Former  Building services engineer Use: Never used  Substance and Sexual Activity   Alcohol use: Yes   Drug use: No   Sexual activity: Not on file  Other Topics Concern   Not on file  Social History Narrative   ** Merged History Encounter **       ** Merged History Encounter **       Social Determinants of Health   Financial Resource Strain: Not on file  Food Insecurity: Not on file  Transportation Needs: Not on file  Physical Activity: Not on file  Stress: Not on file  Social Connections: Not on file  Intimate Partner Violence: Not on file    Review of Systems  Constitutional:  Negative for chills, diaphoresis, fever, malaise/fatigue and weight loss.  HENT:  Negative for congestion, hearing loss, nosebleeds, sore throat and tinnitus.   Eyes:  Positive for blurred vision. Negative for photophobia and redness.  Respiratory:  Negative for cough, hemoptysis, sputum production, shortness of breath, wheezing and stridor.   Cardiovascular:  Negative for chest pain, palpitations, orthopnea, claudication, leg  swelling and PND.  Gastrointestinal:  Negative for abdominal pain, blood in stool, constipation, diarrhea, heartburn, nausea and vomiting.  Genitourinary:  Negative for dysuria, flank pain, frequency, hematuria and urgency.  Musculoskeletal:  Negative for back pain, falls, joint pain, myalgias and neck pain.  Skin:  Negative for itching and rash.  Neurological:  Positive for weakness. Negative for dizziness, tingling, tremors, sensory change, speech change, focal weakness, seizures, loss of consciousness and headaches.  Endo/Heme/Allergies:  Negative for environmental allergies and polydipsia. Does not bruise/bleed easily.   Psychiatric/Behavioral:  Negative for depression, memory loss, substance abuse and suicidal ideas. The patient is not nervous/anxious and does not have insomnia.         Objective    There were no vitals taken for this visit.  Physical Exam  {Labs (Optional):23779}    Assessment & Plan:   Problem List Items Addressed This Visit   None   No follow-ups on file.   Shan Levans, MD

## 2022-06-05 NOTE — Congregational Nurse Program (Signed)
  Dept: [phone]   Congregational Nurse Program Note  Date of Encounter: 06/05/2022  Past Medical History: Past Medical History:  Diagnosis Date   Asthma    Language barrier to communication     Encounter Details:  CNP Questionnaire - 06/05/22 1615       Questionnaire   Ask client: Do you give verbal consent for me to treat you today? Yes    Student Assistance N/A    Location Patient Served  [facility]    Visit Setting with Client Organization    Patient Status Unknown    Insurance Unknown    Insurance/Financial Assistance Referral N/A    Medication N/A    Medical Provider No    Screening Referrals Made N/A    Medical Referrals Made [facility]    Medical Appointment Made N/A    Recently w/o PCP, now 1st time PCP visit completed due to CNs referral or appointment made N/A    Food N/A    Transportation N/A    Housing/Utilities N/A    Interpersonal Safety N/A    Interventions  [employer] [facility];Advocate/Support;Case Management;Counsel    Abnormal to Normal Screening Since Last CN Visit Blood Glucose    Screenings CN Performed N/A    Sent Client to Lab for: N/A    Did client attend any of the following based off CNs referral or appointments made? Medication Assistance    ED Visit Averted N/A    Life-Saving Intervention Made N/A             Patient has hx of alcoholism and diabetes, currently not on any medications. Glucose level 224, last meal was at 1230pm. Patient has financial constraints medications prescribed from Dr.Wright could be sent over to [facility] outpatient pharmacy and sent under [employer] and [employer], water Program grant.   States his first AA meeting was yesterday 06/04/2022, a friend took him. Has no family here, lives w a Radio broadcast assistant. States his sight has declined in the last two months, has trouble driving at night. Family history of diabetes, brother has DM.

## 2022-06-06 ENCOUNTER — Other Ambulatory Visit: Payer: Self-pay | Admitting: Pharmacist

## 2022-06-06 ENCOUNTER — Ambulatory Visit: Payer: Self-pay | Attending: Critical Care Medicine | Admitting: Critical Care Medicine

## 2022-06-06 ENCOUNTER — Encounter: Payer: Self-pay | Admitting: Critical Care Medicine

## 2022-06-06 ENCOUNTER — Other Ambulatory Visit: Payer: Self-pay

## 2022-06-06 VITALS — BP 145/87 | HR 63 | Wt 144.8 lb

## 2022-06-06 DIAGNOSIS — Z1159 Encounter for screening for other viral diseases: Secondary | ICD-10-CM

## 2022-06-06 DIAGNOSIS — Z114 Encounter for screening for human immunodeficiency virus [HIV]: Secondary | ICD-10-CM

## 2022-06-06 DIAGNOSIS — E785 Hyperlipidemia, unspecified: Secondary | ICD-10-CM

## 2022-06-06 DIAGNOSIS — E1165 Type 2 diabetes mellitus with hyperglycemia: Secondary | ICD-10-CM

## 2022-06-06 DIAGNOSIS — E1169 Type 2 diabetes mellitus with other specified complication: Secondary | ICD-10-CM

## 2022-06-06 DIAGNOSIS — Z23 Encounter for immunization: Secondary | ICD-10-CM

## 2022-06-06 DIAGNOSIS — I10 Essential (primary) hypertension: Secondary | ICD-10-CM

## 2022-06-06 MED ORDER — AMLODIPINE BESYLATE 5 MG PO TABS
5.0000 mg | ORAL_TABLET | Freq: Every day | ORAL | 2 refills | Status: AC
  Filled 2022-06-06: qty 30, 30d supply, fill #0

## 2022-06-06 MED ORDER — PROAIR RESPICLICK 108 (90 BASE) MCG/ACT IN AEPB
2.0000 | INHALATION_SPRAY | Freq: Four times a day (QID) | RESPIRATORY_TRACT | 4 refills | Status: DC | PRN
  Filled 2022-06-06: qty 1, 25d supply, fill #0

## 2022-06-06 MED ORDER — ATORVASTATIN CALCIUM 10 MG PO TABS
10.0000 mg | ORAL_TABLET | Freq: Every day | ORAL | 3 refills | Status: AC
  Filled 2022-06-06: qty 30, 30d supply, fill #0

## 2022-06-06 MED ORDER — ALBUTEROL SULFATE HFA 108 (90 BASE) MCG/ACT IN AERS
2.0000 | INHALATION_SPRAY | Freq: Four times a day (QID) | RESPIRATORY_TRACT | 3 refills | Status: AC | PRN
  Filled 2022-06-06: qty 6.7, 25d supply, fill #0

## 2022-06-06 MED ORDER — METFORMIN HCL 500 MG PO TABS
1000.0000 mg | ORAL_TABLET | Freq: Every day | ORAL | 3 refills | Status: DC
  Filled 2022-06-06: qty 60, 30d supply, fill #0

## 2022-06-06 NOTE — Patient Instructions (Addendum)
Start metformin 2 pills daily with food for diabetes  Start amlodipine 1 pill daily for elevated blood pressure  Start atorvastatin 1 pill daily for elevated cholesterol  Complete screening labs obtained at this visit  Flu vaccine given  Return to see Franky Macho our clinical pharmacist for your blood pressure and diabetes in 4 weeks and then see Dr. Delford Field in 2 months  Limit your alcohol intake  See handout we gave you in Spanish on healthy lifestyle choices including improved diet  Work with [employer] [employer] to get an IDC can get Medicaid  Pick up in Spanish a financial assistance form at the checkout desk  Iniciar metformina 2 pastillas diarias con alimentos para la diabetes  Comience con 1 pastilla de amlodipino al da para la presin arterial elevada.  Comience con atorvastatina 1 pastilla diaria para el colesterol elevado  Laboratorios de deteccin completos obtenidos en esta visita.  Se administra la vacuna contra la gripe  Regrese para ver a Immunologist, nuestro farmacutico clnico, para su presin arterial y diabetes en 4 semanas y luego vea al Dr. Delford Field en 2 meses.  Limite su consumo de alcohol  Vea el folleto que le entregamos en espaol sobre opciones de estilos de vida saludables, incluida una dieta mejorada.  Trabaje con [facility] para obtener un IDC y pueda obtener Medicaid.  Recoja en espaol un formulario de asistencia financiera en la caja.

## 2022-06-06 NOTE — Congregational Nurse Program (Signed)
  Dept: [phone]   Congregational Nurse Program Note  Date of Encounter: 06/06/2022  Past Medical History: Past Medical History:  Diagnosis Date   Asthma    Language barrier to communication     Encounter Details:  CNP Questionnaire - 06/06/22 1334       Questionnaire   Ask client: Do you give verbal consent for me to treat you today? Yes    Student Assistance N/A    Location Patient Presenter, [facility]    Visit Setting with Client Phone/Text/Email    Patient Status Migrant    Insurance Uninsured ([employer] Card/Care Connects/Self-Pay/Medicaid Family Planning)    Insurance/Financial Assistance Referral N/A    Medication Have Medication Insecurities    Medical Provider Yes    Screening Referrals Made Annual Wellness Visit    Medical Referrals Made [facility]    Medical Appointment Made [facility]    Recently w/o PCP, now 1st time PCP visit completed due to CNs referral or appointment made Yes    Food N/A    Transportation N/A    Housing/Utilities N/A    Interpersonal Safety N/A    Interventions Reviewed Medications    Abnormal to Normal Screening Since Last CN Visit N/A    Screenings CN Performed N/A    Sent Client to Lab for: N/A    Did client attend any of the following based off CNs referral or appointments made? Medication Assistance    ED Visit Averted N/A    Life-Saving Intervention Made N/A             Client was called to inform him it would be $14 dollars for medication. Client to pick up prescribed medications at 2pm today. Client to follow up at [facility] to review medications and apply for the orange card on 06/26/2021.

## 2022-06-07 DIAGNOSIS — I10 Essential (primary) hypertension: Secondary | ICD-10-CM | POA: Insufficient documentation

## 2022-06-07 DIAGNOSIS — E1165 Type 2 diabetes mellitus with hyperglycemia: Secondary | ICD-10-CM | POA: Insufficient documentation

## 2022-06-07 LAB — COMPREHENSIVE METABOLIC PANEL
ALT: 17 IU/L (ref 0–44)
AST: 17 IU/L (ref 0–40)
Albumin/Globulin Ratio: 1 — ABNORMAL LOW (ref 1.2–2.2)
Albumin: 4.1 g/dL (ref 3.8–4.9)
Alkaline Phosphatase: 156 IU/L — ABNORMAL HIGH (ref 44–121)
BUN/Creatinine Ratio: 10 (ref 9–20)
BUN: 7 mg/dL (ref 6–24)
Bilirubin Total: 0.7 mg/dL (ref 0.0–1.2)
CO2: 25 mmol/L (ref 20–29)
Calcium: 9.6 mg/dL (ref 8.7–10.2)
Chloride: 99 mmol/L (ref 96–106)
Creatinine, Ser: 0.72 mg/dL — ABNORMAL LOW (ref 0.76–1.27)
Globulin, Total: 4 g/dL (ref 1.5–4.5)
Glucose: 250 mg/dL — ABNORMAL HIGH (ref 70–99)
Potassium: 4.8 mmol/L (ref 3.5–5.2)
Sodium: 136 mmol/L (ref 134–144)
Total Protein: 8.1 g/dL (ref 6.0–8.5)
eGFR: 111 mL/min/{1.73_m2} (ref >59)

## 2022-06-07 LAB — CBC WITH DIFFERENTIAL/PLATELET
Basophils Absolute: 0.1 10*3/uL (ref 0.0–0.2)
Basos: 1 %
EOS (ABSOLUTE): 0.2 10*3/uL (ref 0.0–0.4)
Eos: 2 %
Hematocrit: 45.6 % (ref 37.5–51.0)
Hemoglobin: 15.2 g/dL (ref 13.0–17.7)
Immature Grans (Abs): 0 10*3/uL (ref 0.0–0.1)
Immature Granulocytes: 0 %
Lymphocytes Absolute: 2.1 10*3/uL (ref 0.7–3.1)
Lymphs: 19 %
MCH: 30.6 pg (ref 26.6–33.0)
MCHC: 33.3 g/dL (ref 31.5–35.7)
MCV: 92 fL (ref 79–97)
Monocytes Absolute: 0.6 10*3/uL (ref 0.1–0.9)
Monocytes: 6 %
Neutrophils Absolute: 7.8 10*3/uL — ABNORMAL HIGH (ref 1.4–7.0)
Neutrophils: 72 %
Platelets: 426 10*3/uL (ref 150–450)
RBC: 4.96 x10E6/uL (ref 4.14–5.80)
RDW: 13.2 % (ref 11.6–15.4)
WBC: 10.7 10*3/uL (ref 3.4–10.8)

## 2022-06-07 LAB — HCV AB W REFLEX TO QUANT PCR: HCV Ab: NONREACTIVE

## 2022-06-07 LAB — LIPID PANEL
Chol/HDL Ratio: 4 ratio (ref 0.0–5.0)
Cholesterol, Total: 201 mg/dL — ABNORMAL HIGH (ref 100–199)
HDL: 50 mg/dL (ref >39)
LDL Chol Calc (NIH): 117 mg/dL — ABNORMAL HIGH (ref 0–99)
Triglycerides: 194 mg/dL — ABNORMAL HIGH (ref 0–149)
VLDL Cholesterol Cal: 34 mg/dL (ref 5–40)

## 2022-06-07 LAB — HEMOGLOBIN A1C
Est. average glucose Bld gHb Est-mCnc: 329 mg/dL
Hgb A1c MFr Bld: 13.1 % — ABNORMAL HIGH (ref 4.8–5.6)

## 2022-06-07 LAB — MICROALBUMIN / CREATININE URINE RATIO
Creatinine, Urine: 64.1 mg/dL
Microalb/Creat Ratio: <5 mg/g creat (ref 0–29)
Microalbumin, Urine: <3 ug/mL

## 2022-06-07 LAB — HIV ANTIBODY (ROUTINE TESTING W REFLEX): HIV Screen 4th Generation wRfx: NONREACTIVE

## 2022-06-07 LAB — HCV INTERPRETATION

## 2022-06-07 NOTE — Assessment & Plan Note (Signed)
Hypertension new diagnosis will begin amlodipine 5 mg daily and gave patient lifestyle medicine handout in Spanish The following Lifestyle Medicine recommendations according to American College of Lifestyle Medicine [facility]) were discussed and offered to patient who agrees to start the journey:  A. Whole Foods, Plant-based plate comprising of fruits and vegetables, plant-based proteins, whole-grain carbohydrates was discussed in detail with the patient.   A list for source of those nutrients were also provided to the patient.  Patient will use only water or unsweetened tea for hydration. B.  The need to stay away from risky substances including alcohol, smoking; obtaining 7 to 9 hours of restorative sleep, at least 150 minutes of moderate intensity exercise weekly, the importance of healthy social connections,  and stress reduction techniques were discussed. C.  A full color page of  Calorie density of various food groups per pound showing examples of each food groups was provided to the patient.

## 2022-06-07 NOTE — Progress Notes (Signed)
Kidney normal  liver normal, A1C very high as expected take medications as ordered, blood count normal,  HIV hep C neg.  Urine protein normal, choesterol high   Luke adding you, pls get this pt in as new DM patient needs more than metformin A1C 15

## 2022-06-07 NOTE — Assessment & Plan Note (Signed)
Will issue glucometer and begin metformin 500 mg daily The following Lifestyle Medicine recommendations according to American College of Lifestyle Medicine [facility]) were discussed and offered to patient who agrees to start the journey:  A. Whole Foods, Plant-based plate comprising of fruits and vegetables, plant-based proteins, whole-grain carbohydrates was discussed in detail with the patient.   A list for source of those nutrients were also provided to the patient.  Patient will use only water or unsweetened tea for hydration. B.  The need to stay away from risky substances including alcohol, smoking; obtaining 7 to 9 hours of restorative sleep, at least 150 minutes of moderate intensity exercise weekly, the importance of healthy social connections,  and stress reduction techniques were discussed. C.  A full color page of  Calorie density of various food groups per pound showing examples of each food groups was provided to the patient.

## 2022-06-07 NOTE — Assessment & Plan Note (Signed)
Begin atorvastatin daily

## 2022-06-12 ENCOUNTER — Telehealth: Payer: Self-pay

## 2022-06-12 NOTE — Telephone Encounter (Signed)
Pt was called and is aware of results, DOB was confirmed.   Interpreter id # [id number]

## 2022-06-12 NOTE — Telephone Encounter (Signed)
Pt was called and is aware of results, DOB was confirmed.     Interpreter id # Mikle Bosworth [phone]

## 2022-06-12 NOTE — Telephone Encounter (Signed)
-----   Message from Storm Frisk, MD sent at 06/07/2022  1:53 PM EST ----- Kidney normal  liver normal, A1C very high as expected take medications as ordered, blood count normal,  HIV hep C neg.  Urine protein normal, choesterol high   Luke adding you, pls get this pt in as new DM patient needs more than metformin A1C 15

## 2022-07-09 ENCOUNTER — Encounter: Payer: Self-pay | Admitting: Pharmacist

## 2022-07-09 ENCOUNTER — Ambulatory Visit: Payer: Self-pay | Attending: Critical Care Medicine | Admitting: Pharmacist

## 2022-07-09 ENCOUNTER — Other Ambulatory Visit: Payer: Self-pay

## 2022-07-09 VITALS — BP 110/69 | HR 77

## 2022-07-09 DIAGNOSIS — E1165 Type 2 diabetes mellitus with hyperglycemia: Secondary | ICD-10-CM

## 2022-07-09 MED ORDER — METFORMIN HCL 500 MG PO TABS
1000.0000 mg | ORAL_TABLET | Freq: Two times a day (BID) | ORAL | 1 refills | Status: AC
  Filled 2022-07-09: qty 120, 30d supply, fill #0

## 2022-07-09 MED ORDER — TRUE METRIX BLOOD GLUCOSE TEST VI STRP
ORAL_STRIP | 12 refills | Status: AC
  Filled 2022-07-09: qty 100, 33d supply, fill #0

## 2022-07-09 MED ORDER — TRUE METRIX METER W/DEVICE KIT
PACK | 0 refills | Status: AC
  Filled 2022-07-09: qty 1, 30d supply, fill #0

## 2022-07-09 MED ORDER — TRUEPLUS LANCETS 28G MISC
2 refills | Status: AC
  Filled 2022-07-09: qty 100, 33d supply, fill #0

## 2022-07-09 NOTE — Congregational Nurse Program (Signed)
  Dept: [phone]   Congregational Nurse Program Note  Date of Encounter: 07/09/2022  Past Medical History: Past Medical History:  Diagnosis Date   Asthma    Language barrier to communication     Encounter Details:  CNP Questionnaire - 07/09/22 1438       Questionnaire   Ask client: Do you give verbal consent for me to treat you today? Yes    Student Assistance N/A    Location Patient Served  [facility]    Visit Setting with Client Phone/Text/Email    Patient Status Unknown    Insurance Uninsured ([employer] Card/Care Connects/Self-Pay/Medicaid Family Planning)    Insurance/Financial Assistance Referral N/A    Medication Have Medication Insecurities    Medical Provider Yes    Screening Referrals Made Annual Wellness Visit    Medical Referrals Made [facility]    Medical Appointment Made [facility]    Recently w/o PCP, now 1st time PCP visit completed due to CNs referral or appointment made Yes    Food N/A    Transportation N/A    Housing/Utilities N/A    Interpersonal Safety N/A    Interventions Counsel    Abnormal to Normal Screening Since Last CN Visit N/A    Screenings CN Performed N/A    Sent Client to Lab for: N/A    Did client attend any of the following based off CNs referral or appointments made? Medication Assistance    ED Visit Averted N/A    Life-Saving Intervention Made N/A             Client spoken to on the phone, went over the importance of not missing todays appointment. Client concerned for financial burden of medication cost, client was informed he was going for a follow up only for today's appt. Client verbalized understanding and stated he would attend appt.

## 2022-07-09 NOTE — Progress Notes (Deleted)
S:     No chief complaint on file.  52 y.o. male who presents for diabetes evaluation, education, and management.  PMH is significant for HTN, HLD, and T2DM.  Patient was referred and last seen by Primary Care Provider, Dr. Joya Gaskins, on 06/06/2022. A1c at that visit was found to be 13.1%.   Patient reports Diabetes was diagnosed in ***.   Family/Social History:  Fhx: no pertinent positives  Tobacco: never smoker Alcohol: none  Current diabetes medications include: metformin 1000 mg daily with breakfast (takes two 500 mg tabs_  Current hypertension medications include: amlodipine 5 mg daily  Current hyperlipidemia medications include: atorvastatin 10 mg daily  Patient reports adherence to taking all medications as prescribed.   Insurance coverage: none  Patient {Actions; denies-reports:120008} hypoglycemic events.  Reported home fasting blood sugars: ***  Reported 2 hour post-meal/random blood sugars: ***.  Patient {Actions; denies-reports:120008} nocturia (nighttime urination).  Patient {Actions; denies-reports:120008} neuropathy (nerve pain). Patient {Actions; denies-reports:120008} visual changes. Patient {Actions; denies-reports:120008} self foot exams.   Patient reported dietary habits: Eats *** meals/day Breakfast: *** Lunch: *** Dinner: *** Snacks: *** Drinks: ***  Patient-reported exercise habits: ***  O:  7 day average blood glucose: ***  *** CGM Download:  % Time CGM is active: ***% Average Glucose: *** mg/dL Glucose Management Indicator: ***  Glucose Variability: *** (goal <36%) Time in Goal:  - Time in range 70-180: ***% - Time above range: ***% - Time below range: ***% Observed patterns:   Lab Results  Component Value Date   HGBA1C 13.1 (H) 06/06/2022   There were no vitals filed for this visit.  Lipid Panel     Component Value Date/Time   CHOL 201 (H) 06/06/2022 1215   TRIG 194 (H) 06/06/2022 1215   HDL 50 06/06/2022 1215   CHOLHDL  4.0 06/06/2022 1215   CHOLHDL 5.3 (H) 05/08/2017 1403   LDLCALC 117 (H) 06/06/2022 1215   LDLCALC 149 (H) 05/08/2017 1403    Clinical Atherosclerotic Cardiovascular Disease (ASCVD): No  The 10-year ASCVD risk score (Arnett DK, et al., 2019) is: 11.6%   Values used to calculate the score:     Age: 49 years     Sex: Male     Is Non-Hispanic African American: No     Diabetic: Yes     Tobacco smoker: No     Systolic Blood Pressure: 160 mmHg     Is BP treated: Yes     HDL Cholesterol: 50 mg/dL     Total Cholesterol: 201 mg/dL   A/P: Diabetes longstanding *** currently ***. Patient is *** able to verbalize appropriate hypoglycemia management plan. Medication adherence appears ***. Control is suboptimal due to ***. -{Meds adjust:18428} basal insulin *** (insulin ***). Patient will continue to titrate 1 unit every *** days if fasting blood sugar > 100mg /dl until fasting blood sugars reach goal or next visit.  -{Meds adjust:18428} rapid insulin *** (insulin ***) to ***.  -{Meds adjust:18428} GLP-1 *** (generic ***) to ***.  -{Meds adjust:18428} SGLT2-I *** (generic ***) to ***. Counseled on sick day rules. -{Meds adjust:18428} metformin *** to ***.  -Patient educated on purpose, proper use, and potential adverse effects of ***.  -Extensively discussed pathophysiology of diabetes, recommended lifestyle interventions, dietary effects on blood sugar control.  -Counseled on s/sx of and management of hypoglycemia.  -Next A1c anticipated ***.   ASCVD risk - primary ***secondary prevention in patient with diabetes. Last LDL is *** not at goal of <70 *** mg/dL.  ASCVD risk factors include *** and 10-year ASCVD risk score of ***. {Desc; low/moderate/high:110033} intensity statin indicated.  -{Meds adjust:18428} ***statin *** mg.   Hypertension longstanding *** currently ***. Blood pressure goal of <130/80 *** mmHg. Medication adherence ***. Blood pressure control is suboptimal due to  ***. -***  Written patient instructions provided. Patient verbalized understanding of treatment plan.  Total time in face to face counseling *** minutes.    Follow-up:  Pharmacist ***. PCP clinic visit in ***.  Patient seen with ***.

## 2022-07-09 NOTE — Progress Notes (Signed)
S:    No chief complaint on file.  52 y.o. male who presents for diabetes evaluation, education, and management.  PMH is significant for HTN, HLD, and T2DM.  Patient was referred and last seen by Primary Care Provider, Dr. Joya Gaskins, on 06/06/2022. A1c at that visit was found to be 13.1%.   Today, patient arrives in fair spirits and presents with the assistance of an Technical brewer. Patient states he is under a lot of stress because he has not been able to work and he is concerned about his ability to pay for his medicines. He has not checked his blood sugar as he does not have a meter. Of note, patient had not filled metformin for 1 year prior to being restarted in December 2023. BP in clinic at goal, 110/69 mmHg.   Family/Social History:  Fhx: no pertinent positives  Tobacco: never smoker Alcohol: none  Current diabetes medications include: metformin 500 mg BID  Current hypertension medications include: amlodipine 5 mg daily  Current hyperlipidemia medications include: atorvastatin 10 mg daily  Patient reports adherence to taking all medications as prescribed.   Insurance coverage: none  Patient denies hypoglycemic events.  Reported home fasting blood sugars: not checking  Reported 2 hour post-meal/random blood sugars: not checking.  Patient denies nocturia (nighttime urination)- improved Patient denies neuropathy (nerve pain). Patient denies visual changes. Patient reports self foot exams.   O:   Lab Results  Component Value Date   HGBA1C 13.1 (H) 06/06/2022   Vitals:   07/09/22 1406  BP: 110/69  Pulse: 77    Lipid Panel     Component Value Date/Time   CHOL 201 (H) 06/06/2022 1215   TRIG 194 (H) 06/06/2022 1215   HDL 50 06/06/2022 1215   CHOLHDL 4.0 06/06/2022 1215   CHOLHDL 5.3 (H) 05/08/2017 1403   LDLCALC 117 (H) 06/06/2022 1215   LDLCALC 149 (H) 05/08/2017 1403    Clinical Atherosclerotic Cardiovascular Disease (ASCVD): No  The 10-year ASCVD risk  score (Arnett DK, et al., 2019) is: 7.2%   Values used to calculate the score:     Age: 22 years     Sex: Male     Is Non-Hispanic African American: No     Diabetic: Yes     Tobacco smoker: No     Systolic Blood Pressure: 409 mmHg     Is BP treated: Yes     HDL Cholesterol: 50 mg/dL     Total Cholesterol: 201 mg/dL   A/P: Diabetes longstanding currently above goal based on A1c. Patient is able to verbalize appropriate hypoglycemia management plan. Medication adherence appears appropriate. Patient expresses great concern about affording his medications. A1c elevated in the setting patient being lost to follow up for many years. Of note, he was off metformin for ~1 year before restarting metformin in December 2023. -Defer initiation of insulin as hyperglycemic symptoms have resolved since he was able to restart metformin and he has not been able to check blood sugar d/t not having a meter.  -Defer initiation of SGLT2-I until A1c closer to goal.  -Increased dose of metformin to 1000 mg BID. BMET today pending.  -Sent in prescription for testing supplies.  -Patient educated on purpose, proper use, and potential adverse effects of metformin.  -Extensively discussed pathophysiology of diabetes, recommended lifestyle interventions, dietary effects on blood sugar control.  -Counseled on s/sx of and management of hypoglycemia.  -Next A1c anticipated March 2024.   Hypertension longstanding, currently at goal based  on clinic BP (110/69 mmHg). Blood pressure goal of <130/80 mmHg. Medication adherence reported.  -Continue amlodipine 5 mg daily.   Written patient instructions provided. Patient verbalized understanding of treatment plan.  Total time in face to face counseling 30 minutes.    Follow-up:  Pharmacist PRN. PCP clinic visit on 08/14/2022  Joseph Art, Pharm.D. PGY-2 Ambulatory Care Pharmacy Resident 07/09/2022 4:27 PM

## 2022-07-10 LAB — BMP8+EGFR
BUN/Creatinine Ratio: 10 (ref 9–20)
BUN: 7 mg/dL (ref 6–24)
CO2: 23 mmol/L (ref 20–29)
Calcium: 9.3 mg/dL (ref 8.7–10.2)
Chloride: 99 mmol/L (ref 96–106)
Creatinine, Ser: 0.67 mg/dL — ABNORMAL LOW (ref 0.76–1.27)
Glucose: 170 mg/dL — ABNORMAL HIGH (ref 70–99)
Potassium: 4.2 mmol/L (ref 3.5–5.2)
Sodium: 140 mmol/L (ref 134–144)
eGFR: 112 mL/min/{1.73_m2} (ref >59)

## 2022-08-12 NOTE — Progress Notes (Deleted)
New Patient Office Visit  Subjective    Patient ID: Mark Love, male    DOB: 1970-12-22  Age: 52 y.o. MRN: [id number]  CC:  No chief complaint on file.   HPI 06/06/22 Mark Love presents to establish care Last seen [facility] in 2021 asthma/ HLD The visit was assisted by Spanish interpreter virtual [phone] Patient comes to the office after having been last seen in 2021 for asthma flare.  Patient has been in the emergency room in September and twice in October for cough and asthma.  Patient also comes in with elevated blood pressure and elevated blood sugar.  He has a history of alcohol use and is now joined AA and has been sober for about 4 months.  He was drinking more heavily before this.  Patient is a former tobacco user.  He needs health screenings.  However he is uninsured. Patient does complain of blurred vision  08/14/22   Outpatient Encounter Medications as of 08/14/2022  Medication Sig   acetaminophen (TYLENOL) 325 MG tablet Take 2 tablets (650 mg total) by mouth every 6 (six) hours as needed. (Patient not taking: Reported on 06/06/2022)   albuterol (PROAIR HFA) 108 (90 Base) MCG/ACT inhaler Inhale 2 puffs into the lungs every 6 (six) hours as needed for wheezing or shortness of breath.   amLODipine (NORVASC) 5 MG tablet Take 1 tablet (5 mg total) by mouth daily.   atorvastatin (LIPITOR) 10 MG tablet Take 1 tablet (10 mg total) by mouth daily.   benzonatate (TESSALON) 100 MG capsule Take 1 capsule (100 mg total) by mouth 3 (three) times daily as needed for cough. (Patient not taking: Reported on 06/06/2022)   Blood Glucose Monitoring Suppl (TRUE METRIX METER) w/Device KIT Use to check blood sugar three times daily.   fluticasone (FLONASE) 50 MCG/ACT nasal spray Place 2 sprays into both nostrils daily for 7 days.   fluticasone (FLONASE) 50 MCG/ACT nasal spray Place 1 spray into both nostrils daily for 7 days.   glucose blood (TRUE METRIX BLOOD GLUCOSE TEST)  test strip Use to check blood sugar three times daily.   guaiFENesin (ROBITUSSIN) 100 MG/5ML liquid Take 5 mLs by mouth every 4 (four) hours as needed for cough or to loosen phlegm. For daytime (Patient not taking: Reported on 06/06/2022)   guaiFENesin-codeine 100-10 MG/5ML syrup Take 10 mLs by mouth at bedtime as needed for cough. (Patient not taking: Reported on 06/06/2022)   ibuprofen (ADVIL) 600 MG tablet Take 1 tablet (600 mg total) by mouth every 6 (six) hours as needed. (Patient not taking: Reported on 06/06/2022)   metFORMIN (GLUCOPHAGE) 500 MG tablet Take 2 tablets (1,000 mg total) by mouth 2 (two) times daily.   methylPREDNISolone (MEDROL DOSEPAK) 4 MG TBPK tablet Use as directed (Patient not taking: Reported on 06/06/2022)   pseudoephedrine (SUDAFED) 60 MG tablet Take 1 tablet (60 mg total) by mouth every 6 (six) hours as needed for congestion. (Patient not taking: Reported on 06/06/2022)   TRUEplus Lancets 28G MISC Use to check blood sugar three times daily.   No facility-administered encounter medications on file as of 08/14/2022.    Past Medical History:  Diagnosis Date   Asthma    Language barrier to communication     No past surgical history on file.  No family history on file.  Social History   Socioeconomic History   Marital status: Single    Spouse name: Not on file   Number of children: Not on file  Years of education: Not on file   Highest education level: Not on file  Occupational History   Not on file  Tobacco Use   Smoking status: Never   Smokeless tobacco: Former  Scientific laboratory technician Use: Never used  Substance and Sexual Activity   Alcohol use: Not Currently   Drug use: Not Currently    Types: Cocaine   Sexual activity: Never  Other Topics Concern   Not on file  Social History Narrative   ** Merged History Encounter **       ** Merged History Encounter **       Social Determinants of Health   Financial Resource Strain: Not on file  Food  Insecurity: Not on file  Transportation Needs: Not on file  Physical Activity: Not on file  Stress: Not on file  Social Connections: Not on file  Intimate Partner Violence: Not on file    Review of Systems  Constitutional:  Negative for chills, diaphoresis, fever, malaise/fatigue and weight loss.  HENT:  Negative for congestion, hearing loss, nosebleeds, sore throat and tinnitus.   Eyes:  Positive for blurred vision. Negative for photophobia and redness.  Respiratory:  Negative for cough, hemoptysis, sputum production, shortness of breath, wheezing and stridor.   Cardiovascular:  Negative for chest pain, palpitations, orthopnea, claudication, leg swelling and PND.  Gastrointestinal:  Negative for abdominal pain, blood in stool, constipation, diarrhea, heartburn, nausea and vomiting.  Genitourinary:  Negative for dysuria, flank pain, frequency, hematuria and urgency.  Musculoskeletal:  Negative for back pain, falls, joint pain, myalgias and neck pain.  Skin:  Negative for itching and rash.  Neurological:  Positive for weakness. Negative for dizziness, tingling, tremors, sensory change, speech change, focal weakness, seizures, loss of consciousness and headaches.  Endo/Heme/Allergies:  Negative for environmental allergies and polydipsia. Does not bruise/bleed easily.  Psychiatric/Behavioral:  Negative for depression, memory loss, substance abuse and suicidal ideas. The patient is not nervous/anxious and does not have insomnia.         Objective    There were no vitals taken for this visit.  Physical Exam Vitals reviewed.  Constitutional:      Appearance: Normal appearance. He is well-developed. He is not diaphoretic.  HENT:     Head: Normocephalic and atraumatic.     Nose: No nasal deformity, septal deviation, mucosal edema or rhinorrhea.     Right Sinus: No maxillary sinus tenderness or frontal sinus tenderness.     Left Sinus: No maxillary sinus tenderness or frontal sinus  tenderness.     Mouth/Throat:     Pharynx: No oropharyngeal exudate.  Eyes:     General: No scleral icterus.    Conjunctiva/sclera: Conjunctivae normal.     Pupils: Pupils are equal, round, and reactive to light.  Neck:     Thyroid: No thyromegaly.     Vascular: No carotid bruit or JVD.     Trachea: Trachea normal. No tracheal tenderness or tracheal deviation.  Cardiovascular:     Rate and Rhythm: Normal rate and regular rhythm.     Chest Wall: PMI is not displaced.     Pulses: Normal pulses. No decreased pulses.     Heart sounds: Normal heart sounds, S1 normal and S2 normal. Heart sounds not distant. No murmur heard.    No systolic murmur is present.     No diastolic murmur is present.     No friction rub. No gallop. No S3 or S4 sounds.  Pulmonary:  Effort: No tachypnea, accessory muscle usage or respiratory distress.     Breath sounds: No stridor. No decreased breath sounds, wheezing, rhonchi or rales.  Chest:     Chest wall: No tenderness.  Abdominal:     General: Bowel sounds are normal. There is no distension.     Palpations: Abdomen is soft. Abdomen is not rigid.     Tenderness: There is no abdominal tenderness. There is no guarding or rebound.  Musculoskeletal:        General: Normal range of motion.     Cervical back: Normal range of motion and neck supple. No edema, erythema or rigidity. No muscular tenderness. Normal range of motion.     Comments: Foot exam normal  Lymphadenopathy:     Head:     Right side of head: No submental or submandibular adenopathy.     Left side of head: No submental or submandibular adenopathy.     Cervical: No cervical adenopathy.  Skin:    General: Skin is warm and dry.     Coloration: Skin is not pale.     Findings: No rash.     Nails: There is no clubbing.  Neurological:     Mental Status: He is alert and oriented to person, place, and time.     Sensory: No sensory deficit.  Psychiatric:        Speech: Speech normal.         Behavior: Behavior normal.         Assessment & Plan:   Problem List Items Addressed This Visit   None  No follow-ups on file.   Asencion Noble, MD

## 2022-08-14 ENCOUNTER — Ambulatory Visit: Payer: Self-pay | Admitting: Critical Care Medicine

## 2023-11-26 ENCOUNTER — Other Ambulatory Visit: Payer: Self-pay

## 2023-11-26 ENCOUNTER — Emergency Department (HOSPITAL_COMMUNITY)
Admission: EM | Admit: 2023-11-26 | Discharge: 2023-11-27 | Disposition: A | Discharge status: Home / Self Care | Payer: Self-pay | Attending: Emergency Medicine | Admitting: Emergency Medicine

## 2023-11-26 ENCOUNTER — Emergency Department (HOSPITAL_COMMUNITY): Payer: Self-pay

## 2023-11-26 ENCOUNTER — Encounter (HOSPITAL_COMMUNITY): Payer: Self-pay

## 2023-11-26 DIAGNOSIS — J45909 Unspecified asthma, uncomplicated: Secondary | ICD-10-CM | POA: Insufficient documentation

## 2023-11-26 DIAGNOSIS — R066 Hiccough: Secondary | ICD-10-CM

## 2023-11-26 DIAGNOSIS — I10 Essential (primary) hypertension: Secondary | ICD-10-CM | POA: Insufficient documentation

## 2023-11-26 DIAGNOSIS — E119 Type 2 diabetes mellitus without complications: Secondary | ICD-10-CM | POA: Insufficient documentation

## 2023-11-26 DIAGNOSIS — Z79899 Other long term (current) drug therapy: Secondary | ICD-10-CM | POA: Insufficient documentation

## 2023-11-26 DIAGNOSIS — Z7984 Long term (current) use of oral hypoglycemic drugs: Secondary | ICD-10-CM | POA: Insufficient documentation

## 2023-11-26 LAB — CBC
HCT: 45.1 % (ref 39.0–52.0)
Hemoglobin: 15.4 g/dL (ref 13.0–17.0)
MCH: 32.6 pg (ref 26.0–34.0)
MCHC: 34.1 g/dL (ref 30.0–36.0)
MCV: 95.3 fL (ref 80.0–100.0)
Platelets: 319 10*3/uL (ref 150–400)
RBC: 4.73 MIL/uL (ref 4.22–5.81)
RDW: 13.1 % (ref 11.5–15.5)
WBC: 9.3 10*3/uL (ref 4.0–10.5)
nRBC: 0 % (ref 0.0–0.2)

## 2023-11-26 LAB — URINALYSIS, ROUTINE W REFLEX MICROSCOPIC
Bilirubin Urine: NEGATIVE
Glucose, UA: NEGATIVE mg/dL
Hgb urine dipstick: NEGATIVE
Ketones, ur: NEGATIVE mg/dL
Leukocytes,Ua: NEGATIVE
Nitrite: NEGATIVE
Protein, ur: NEGATIVE mg/dL
Specific Gravity, Urine: 1.001 — ABNORMAL LOW (ref 1.005–1.030)
pH: 7 (ref 5.0–8.0)

## 2023-11-26 LAB — COMPREHENSIVE METABOLIC PANEL WITH GFR
ALT: 39 U/L (ref 0–44)
AST: 59 U/L — ABNORMAL HIGH (ref 15–41)
Albumin: 4.1 g/dL (ref 3.5–5.0)
Alkaline Phosphatase: 119 U/L (ref 38–126)
Anion gap: 14 (ref 5–15)
BUN: 6 mg/dL (ref 6–20)
CO2: 24 mmol/L (ref 22–32)
Calcium: 8.7 mg/dL — ABNORMAL LOW (ref 8.9–10.3)
Chloride: 98 mmol/L (ref 98–111)
Creatinine, Ser: 0.63 mg/dL (ref 0.61–1.24)
GFR, Estimated: >60 mL/min (ref >60)
Glucose, Bld: 109 mg/dL — ABNORMAL HIGH (ref 70–99)
Potassium: 3.6 mmol/L (ref 3.5–5.1)
Sodium: 136 mmol/L (ref 135–145)
Total Bilirubin: 0.9 mg/dL (ref 0.0–1.2)
Total Protein: 8.4 g/dL — ABNORMAL HIGH (ref 6.5–8.1)

## 2023-11-26 LAB — LIPASE, BLOOD: Lipase: 43 U/L (ref 11–51)

## 2023-11-26 NOTE — ED Triage Notes (Signed)
 Pt. Arrives stating that he feels like something is stuck in his throat. Endorse nausea, vomiting and diarrhea on and off. Denies chest pain and SOB. Pt. Is not in any apparent respiratory distress. Able to speak in full sentences.

## 2023-11-27 MED ORDER — METHOCARBAMOL 500 MG PO TABS
500.0000 mg | ORAL_TABLET | Freq: Once | ORAL | Status: AC
  Administered 2023-11-27: 500 mg via ORAL
  Filled 2023-11-27: qty 1

## 2023-11-27 MED ORDER — ALBUTEROL SULFATE HFA 108 (90 BASE) MCG/ACT IN AERS
1.0000 | INHALATION_SPRAY | Freq: Once | RESPIRATORY_TRACT | Status: AC
  Administered 2023-11-27: 1 via RESPIRATORY_TRACT
  Filled 2023-11-27: qty 6.7

## 2023-11-27 NOTE — ED Provider Notes (Signed)
  EMERGENCY DEPARTMENT AT [facility] Provider Note   CSN: [id number] Arrival date & time: 11/26/23  2313     History  Chief Complaint  Patient presents with   Dysphagia    Mark Love is a 53 y.o. male.  Patient with past medical history significant for type II DM, hypertension, asthma presents the emergency room complaining of cough and hiccups which began 1 hour prior to arrival.  He denies chest pain, shortness of breath, abdominal pain.  He denies any difficulty swallowing.  He states that he has intermittent nausea, vomiting, and diarrhea but has had none today.  He endorses drinking alcohol prior to arrival.  HPI     Home Medications Prior to Admission medications   Medication Sig Start Date End Date Taking? Authorizing Provider  acetaminophen  (TYLENOL ) 325 MG tablet Take 2 tablets (650 mg total) by mouth every 6 (six) hours as needed. Patient not taking: Reported on 06/06/2022 03/20/22   Russella Courts A, DO  albuterol  (PROAIR  HFA) 108 [phone] Base) MCG/ACT inhaler Inhale 2 puffs into the lungs every 6 (six) hours as needed for wheezing or shortness of breath. 06/06/22   Vernell Goldsmith, MD  amLODipine  (NORVASC ) 5 MG tablet Take 1 tablet (5 mg total) by mouth daily. 06/06/22   Vernell Goldsmith, MD  atorvastatin  (LIPITOR) 10 MG tablet Take 1 tablet (10 mg total) by mouth daily. 06/06/22   Vernell Goldsmith, MD  benzonatate  (TESSALON ) 100 MG capsule Take 1 capsule (100 mg total) by mouth 3 (three) times daily as needed for cough. Patient not taking: Reported on 06/06/2022 06/24/21   Long, Joshua G, MD  Blood Glucose Monitoring Suppl (TRUE METRIX METER) w/Device KIT Use to check blood sugar three times daily. 07/09/22   Vernell Goldsmith, MD  fluticasone  (FLONASE ) 50 MCG/ACT nasal spray Place 2 sprays into both nostrils daily for 7 days. 06/24/21 07/24/21  Roberts Ching, MD  fluticasone  (FLONASE ) 50 MCG/ACT nasal spray Place 1 spray into both nostrils daily  for 7 days. 03/20/22 03/27/22  Russella Courts A, DO  glucose blood (TRUE METRIX BLOOD GLUCOSE TEST) test strip Use to check blood sugar three times daily. 07/09/22   Vernell Goldsmith, MD  guaiFENesin  (ROBITUSSIN) 100 MG/5ML liquid Take 5 mLs by mouth every 4 (four) hours as needed for cough or to loosen phlegm. For daytime Patient not taking: Reported on 06/06/2022 03/20/22   Russella Courts A, DO  guaiFENesin -codeine  100-10 MG/5ML syrup Take 10 mLs by mouth at bedtime as needed for cough. Patient not taking: Reported on 06/06/2022 04/02/22   Harris, Abigail, PA-C  ibuprofen  (ADVIL ) 600 MG tablet Take 1 tablet (600 mg total) by mouth every 6 (six) hours as needed. Patient not taking: Reported on 06/06/2022 03/20/22   Russella Courts A, DO  metFORMIN  (GLUCOPHAGE ) 500 MG tablet Take 2 tablets (1,000 mg total) by mouth 2 (two) times daily. 07/09/22   Vernell Goldsmith, MD  methylPREDNISolone  (MEDROL  DOSEPAK) 4 MG TBPK tablet Use as directed Patient not taking: Reported on 06/06/2022 04/02/22   Harris, Abigail, PA-C  pseudoephedrine  (SUDAFED) 60 MG tablet Take 1 tablet (60 mg total) by mouth every 6 (six) hours as needed for congestion. Patient not taking: Reported on 06/06/2022 03/20/22   Russella Courts A, DO  TRUEplus Lancets 28G MISC Use to check blood sugar three times daily. 07/09/22   Vernell Goldsmith, MD      Allergies    Patient has no known allergies.  Review of Systems   Review of Systems  Physical Exam Updated Vital Signs BP (!) 154/96   Pulse 67   Temp 98.3 F (36.8 C) (Oral)   Resp 20   SpO2 97%  Physical Exam Vitals and nursing note reviewed.  Constitutional:      General: He is not in acute distress.    Appearance: He is well-developed.  HENT:     Head: Normocephalic and atraumatic.     Mouth/Throat:     Mouth: Mucous membranes are moist.     Pharynx: Oropharynx is clear. No oropharyngeal exudate or posterior oropharyngeal erythema.  Eyes:     Conjunctiva/sclera: Conjunctivae  normal.  Cardiovascular:     Rate and Rhythm: Normal rate and regular rhythm.  Pulmonary:     Effort: Pulmonary effort is normal. No respiratory distress.     Breath sounds: Wheezing (Mild expiratory wheeze noted in bilateral lower lungs upon arrival, alleviated after albuterol ) present.  Abdominal:     Palpations: Abdomen is soft.     Tenderness: There is no abdominal tenderness.  Musculoskeletal:        General: No swelling.     Cervical back: Neck supple.  Skin:    General: Skin is warm and dry.     Capillary Refill: Capillary refill takes less than 2 seconds.  Neurological:     Mental Status: He is alert.  Psychiatric:        Mood and Affect: Mood normal.     ED Results / Procedures / Treatments   Labs (all labs ordered are listed, but only abnormal results are displayed) Labs Reviewed  COMPREHENSIVE METABOLIC PANEL WITH GFR - Abnormal; Notable for the following components:      Result Value   Glucose, Bld 109 (*)    Calcium  8.7 (*)    Total Protein 8.4 (*)    AST 59 (*)    All other components within normal limits  URINALYSIS, ROUTINE W REFLEX MICROSCOPIC - Abnormal; Notable for the following components:   Color, Urine COLORLESS (*)    Specific Gravity, Urine 1.001 (*)    All other components within normal limits  LIPASE, BLOOD  CBC    EKG None  Radiology DG Neck Soft Tissue Result Date: 11/27/2023 CLINICAL DATA:  Difficulty swallowing EXAM: NECK SOFT TISSUES - 1+ VIEW COMPARISON:  None Available. FINDINGS: There is no evidence of retropharyngeal soft tissue swelling or epiglottic enlargement. The cervical airway is unremarkable and no radio-opaque foreign body identified. IMPRESSION: Negative. Electronically Signed   By: Janeece Mechanic M.D.   On: 11/27/2023 00:14    Procedures Procedures    Medications Ordered in ED Medications  methocarbamol (ROBAXIN) tablet 500 mg (500 mg Oral Given 11/27/23 0055)  albuterol  (VENTOLIN  HFA) 108 (90 Base) MCG/ACT inhaler 1  puff (1 puff Inhalation Given 11/27/23 0056)    ED Course/ Medical Decision Making/ A&P                                 Medical Decision Making Amount and/or Complexity of Data Reviewed Radiology: ordered.  Risk Prescription drug management.   This patient presents to the ED for concern of hiccups and wheezing, this involves an extensive number of treatment options, and is a complaint that carries with it a high risk of complications and morbidity.  The differential diagnosis includes asthma exacerbation, GERD, gastritis, hiatal hernia, intoxication, others   Co morbidities / Chronic  conditions that complicate the patient evaluation  T2DM, asthma   Additional history obtained:  Additional history obtained from EMR   Lab Tests:  I Ordered, and personally interpreted labs.  The pertinent results include: Mildly elevated AST at 59   Imaging Studies ordered:  I ordered imaging studies including plain films neck soft tissue I independently visualized and interpreted imaging which showed no acute findings I agree with the radiologist interpretation   Problem List / ED Course / Critical interventions / Medication management   I ordered medication including Robaxin and albuterol  Reevaluation of the patient after these medicines showed that the patient improved I have reviewed the patients home medicines and have made adjustments as needed   Social Determinants of Health:  Patient states he is unable to afford medications   Test / Admission - Considered:  Patient with very mild expiratory wheezes upon arrival.  These completely subsided with 1 dose of MDI albuterol .  Suspect very mild asthma exacerbation.  Oxygen has been 95+ percent on room air.  Lungs are clear at this time.  No indication for further emergent workup.  Patient reported hiccups but had no hiccups during his time here in the emergency department.  He did endorse alcohol consumption prior to arrival.  Feel  this is likely a Is related to alcohol intoxication.  No indication for further emergent workup at this time.  Labs are unremarkable and suggest no metabolic disturbance.  Lipase is normal showing no signs of pancreatitis.  He has no chest pain, is not vomiting, no suggestion of esophageal rupture.         Final Clinical Impression(s) / ED Diagnoses Final diagnoses:  Hiccups  Asthma, unspecified asthma severity, unspecified whether complicated, unspecified whether persistent    Rx / DC Orders ED Discharge Orders     None         Delories Fetter 11/27/23 0104    Eldon Greenland, MD 11/27/23 [phone]

## 2023-11-27 NOTE — Discharge Instructions (Addendum)
 You may use the albuterol  inhaler at home for wheezing or shortness of breath.  You may take 2 puffs up to every 6 hours as needed.  Please follow-up with your primary care provider for further refills and evaluation of both your hiccups and wheezing.  If you develop any life-threatening symptoms return to the emergency department.
